# Patient Record
Sex: Male | Born: 1970 | Race: Black or African American | Hispanic: No | Marital: Single | Smoking: Never smoker
Health system: Southern US, Community
[De-identification: ages and names within clinical notes are randomized; demographics above are authoritative.]

## PROBLEM LIST (undated history)

## (undated) DIAGNOSIS — E785 Hyperlipidemia, unspecified: Secondary | ICD-10-CM

## (undated) DIAGNOSIS — I10 Essential (primary) hypertension: Secondary | ICD-10-CM

## (undated) DIAGNOSIS — T7840XA Allergy, unspecified, initial encounter: Secondary | ICD-10-CM

## (undated) HISTORY — PX: NO PAST SURGERIES: SHX2092

## (undated) HISTORY — DX: Hyperlipidemia, unspecified: E78.5

## (undated) HISTORY — DX: Essential (primary) hypertension: I10

## (undated) HISTORY — DX: Allergy, unspecified, initial encounter: T78.40XA

---

## 2011-02-11 ENCOUNTER — Emergency Department (HOSPITAL_COMMUNITY)
Admission: EM | Admit: 2011-02-11 | Discharge: 2011-02-12 | Disposition: A | Discharge status: Home / Self Care | Payer: BC Managed Care – PPO | Attending: Emergency Medicine | Admitting: Emergency Medicine

## 2011-02-11 ENCOUNTER — Emergency Department (HOSPITAL_COMMUNITY): Payer: BC Managed Care – PPO

## 2011-02-11 DIAGNOSIS — R1033 Periumbilical pain: Secondary | ICD-10-CM | POA: Insufficient documentation

## 2011-02-11 DIAGNOSIS — R110 Nausea: Secondary | ICD-10-CM | POA: Insufficient documentation

## 2011-02-11 DIAGNOSIS — R630 Anorexia: Secondary | ICD-10-CM | POA: Insufficient documentation

## 2011-02-12 LAB — DIFFERENTIAL
Basophils Relative: 0 % (ref 0–1)
Eosinophils Absolute: 0 10*3/uL (ref 0.0–0.7)
Eosinophils Relative: 0 % (ref 0–5)
Lymphs Abs: 0.9 10*3/uL (ref 0.7–4.0)

## 2011-02-12 LAB — CBC
MCV: 86.3 fL (ref 78.0–100.0)
Platelets: 181 10*3/uL (ref 150–400)
RDW: 13.4 % (ref 11.5–15.5)
WBC: 8 10*3/uL (ref 4.0–10.5)

## 2011-02-12 LAB — URINALYSIS, ROUTINE W REFLEX MICROSCOPIC
Nitrite: NEGATIVE
Specific Gravity, Urine: 1.021 (ref 1.005–1.030)
Urobilinogen, UA: 0.2 mg/dL (ref 0.0–1.0)

## 2011-02-12 LAB — HEPATIC FUNCTION PANEL
ALT: 76 U/L — ABNORMAL HIGH (ref 0–53)
Indirect Bilirubin: 0.4 mg/dL (ref 0.3–0.9)
Total Protein: 7.2 g/dL (ref 6.0–8.3)

## 2011-02-12 LAB — LIPASE, BLOOD: Lipase: 33 U/L (ref 11–59)

## 2011-02-12 LAB — URINE MICROSCOPIC-ADD ON

## 2011-02-12 LAB — BASIC METABOLIC PANEL
BUN: 10 mg/dL (ref 6–23)
Creatinine, Ser: 1.07 mg/dL (ref 0.4–1.5)
GFR calc Af Amer: >60 mL/min (ref >60)
GFR calc non Af Amer: >60 mL/min (ref >60)
Potassium: 4 mEq/L (ref 3.5–5.1)

## 2011-02-12 MED ORDER — IOHEXOL 300 MG/ML  SOLN
100.0000 mL | Freq: Once | INTRAMUSCULAR | Status: AC | PRN
  Administered 2011-02-12: 100 mL via INTRAVENOUS

## 2012-02-17 ENCOUNTER — Ambulatory Visit (INDEPENDENT_AMBULATORY_CARE_PROVIDER_SITE_OTHER): Payer: BC Managed Care – PPO | Admitting: Internal Medicine

## 2012-02-17 VITALS — BP 159/89 | HR 58 | Temp 98.0°F | Resp 12 | Ht 69.0 in | Wt 230.0 lb

## 2012-02-17 DIAGNOSIS — Z0000 Encounter for general adult medical examination without abnormal findings: Secondary | ICD-10-CM

## 2012-02-17 NOTE — Progress Notes (Signed)
  Subjective:    Patient ID: Hunter Adams, male    DOB: 1970/06/28, 42 y.o.   MRN: [id number]  HPI Feels great See scanned hx   Review of Systems    See scanned ros Objective:   Physical Exam  Constitutional: He is oriented to person, place, and time. He appears well-developed and well-nourished.  HENT:  Head: Normocephalic.  Eyes: Conjunctivae and EOM are normal. Pupils are equal, round, and reactive to light.  Neck: Normal range of motion. Neck supple. No thyromegaly present.  Cardiovascular: Normal rate, regular rhythm and normal heart sounds.   Pulmonary/Chest: Effort normal and breath sounds normal.  Abdominal: Soft. Bowel sounds are normal.  Genitourinary: Penis normal.  Musculoskeletal: Normal range of motion.  Lymphadenopathy:    He has no cervical adenopathy.  Neurological: He is alert and oriented to person, place, and time.  Skin: Skin is warm and dry.  Psychiatric: He has a normal mood and affect.          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  Agrees to labs, EKG, CXR  later

## 2012-09-19 ENCOUNTER — Ambulatory Visit (INDEPENDENT_AMBULATORY_CARE_PROVIDER_SITE_OTHER): Payer: BC Managed Care – PPO | Admitting: Emergency Medicine

## 2012-09-19 VITALS — BP 154/88 | HR 85 | Temp 98.1°F | Resp 16 | Ht 70.0 in | Wt 227.0 lb

## 2012-09-19 DIAGNOSIS — H1045 Other chronic allergic conjunctivitis: Secondary | ICD-10-CM

## 2012-09-19 DIAGNOSIS — J309 Allergic rhinitis, unspecified: Secondary | ICD-10-CM

## 2012-09-19 DIAGNOSIS — H1010 Acute atopic conjunctivitis, unspecified eye: Secondary | ICD-10-CM

## 2012-09-19 DIAGNOSIS — I10 Essential (primary) hypertension: Secondary | ICD-10-CM

## 2012-09-19 MED ORDER — FLUTICASONE PROPIONATE 50 MCG/ACT NA SUSP: 2.0000 | Freq: Every day | NASAL | Status: DC

## 2012-09-19 MED ORDER — PREDNISONE 10 MG PO TABS: ORAL_TABLET | ORAL | Status: DC

## 2012-09-19 MED ORDER — HYDROCHLOROTHIAZIDE 12.5 MG PO TABS: 12.5000 mg | ORAL_TABLET | Freq: Every day | ORAL | Status: DC

## 2012-09-19 MED ORDER — OLOPATADINE HCL 0.1 % OP SOLN: 1.0000 [drp] | Freq: Two times a day (BID) | OPHTHALMIC | Status: DC

## 2012-09-19 NOTE — Patient Instructions (Addendum)
Hypertension As your heart beats, it forces blood through your arteries. This force is your blood pressure. If the pressure is too high, it is called hypertension (HTN) or high blood pressure. HTN is dangerous because you may have it and not know it. High blood pressure may mean that your heart has to work harder to pump blood. Your arteries may be narrow or stiff. The extra work puts you at risk for heart disease, stroke, and other problems.  Blood pressure consists of two numbers, a higher number over a lower, 110/72, for example. It is stated as "110 over 72." The ideal is below 120 for the top number (systolic) and under 80 for the bottom (diastolic). Write down your blood pressure today. You should pay close attention to your blood pressure if you have certain conditions such as:  Heart failure.   Prior heart attack.   Diabetes   Chronic kidney disease.   Prior stroke.   Multiple risk factors for heart disease.  To see if you have HTN, your blood pressure should be measured while you are seated with your arm held at the level of the heart. It should be measured at least twice. A one-time elevated blood pressure reading (especially in the Emergency Department) does not mean that you need treatment. There may be conditions in which the blood pressure is different between your right and left arms. It is important to see your caregiver soon for a recheck. Most people have essential hypertension which means that there is not a specific cause. This type of high blood pressure may be lowered by changing lifestyle factors such as:  Stress.   Smoking.   Lack of exercise.   Excessive weight.   Drug/tobacco/alcohol use.   Eating less salt.  Most people do not have symptoms from high blood pressure until it has caused damage to the body. Effective treatment can often prevent, delay or reduce that damage. TREATMENT  When a cause has been identified, treatment for high blood pressure is  directed at the cause. There are a large number of medications to treat HTN. These fall into several categories, and your caregiver will help you select the medicines that are best for you. Medications may have side effects. You should review side effects with your caregiver. If your blood pressure stays high after you have made lifestyle changes or started on medicines,   Your medication(s) may need to be changed.   Other problems may need to be addressed.   Be certain you understand your prescriptions, and know how and when to take your medicine.   Be sure to follow up with your caregiver within the time frame advised (usually within two weeks) to have your blood pressure rechecked and to review your medications.   If you are taking more than one medicine to lower your blood pressure, make sure you know how and at what times they should be taken. Taking two medicines at the same time can result in blood pressure that is too low.  SEEK IMMEDIATE MEDICAL CARE IF:  You develop a severe headache, blurred or changing vision, or confusion.   You have unusual weakness or numbness, or a faint feeling.   You have severe chest or abdominal pain, vomiting, or breathing problems.  MAKE SURE YOU:   Understand these instructions.   Will watch your condition.   Will get help right away if you are not doing well or get worse.  Document Released: 12/17/2005 Document Revised: 12/06/2011 Document Reviewed:   08/06/2008 Exit[employer] Patient Information 2012 [place], [place].Allergic Rhinitis Allergic rhinitis is when the mucous membranes in the nose respond to allergens. Allergens are particles in the air that cause your body to have an allergic reaction. This causes you to release allergic antibodies. Through a chain of events, these eventually cause you to release histamine into the blood stream (hence the use of antihistamines). Although meant to be protective to the body, it is this release that causes your  discomfort, such as frequent sneezing, congestion and an itchy runny nose.  CAUSES  The pollen allergens may come from grasses, trees, and weeds. This is seasonal allergic rhinitis, or "hay fever." Other allergens cause year-round allergic rhinitis (perennial allergic rhinitis) such as house dust mite allergen, pet dander and mold spores.  SYMPTOMS   Nasal stuffiness (congestion).   Runny, itchy nose with sneezing and tearing of the eyes.   There is often an itching of the mouth, eyes and ears.  It cannot be cured, but it can be controlled with medications. DIAGNOSIS  If you are unable to determine the offending allergen, skin or blood testing may find it. TREATMENT   Avoid the allergen.   Medications and allergy shots (immunotherapy) can help.   Hay fever may often be treated with antihistamines in pill or nasal spray forms. Antihistamines block the effects of histamine. There are over-the-counter medicines that may help with nasal congestion and swelling around the eyes. Check with your caregiver before taking or giving this medicine.  If the treatment above does not work, there are many new medications your caregiver can prescribe. Stronger medications may be used if initial measures are ineffective. Desensitizing injections can be used if medications and avoidance fails. Desensitization is when a patient is given ongoing shots until the body becomes less sensitive to the allergen. Make sure you follow up with your caregiver if problems continue. SEEK MEDICAL CARE IF:   You develop fever (more than 100.5 F (38.1 C).   You develop a cough that does not stop easily (persistent).   You have shortness of breath.   You start wheezing.   Symptoms interfere with normal daily activities.  Document Released: 09/11/2001 Document Revised: 12/06/2011 Document Reviewed: 03/23/2009 [facility] Patient Information 2012 [place], [place].

## 2012-09-19 NOTE — Progress Notes (Signed)
  Subjective:    Patient ID: Hunter Adams, male    DOB: May 05, 1970, 42 y.o.   MRN: [id number]  HPI  Patient has been ill for approximately 2 weeks. He has more problems inside rather then going outside. He has significant head congestion and post nasal drip. He has not have had sore throat or cough. He as a history of allergies and has used nasal spray in the past. He was wearing contacts and removed them and his eyes became irritated.    Review of Systems When he was seen last time by Dr. Perrin Maltese he had elevated blood pressure.  He has family history of hypertension.  He has not been on medication for this.    Objective:   Physical Exam patient is alert cooperative in no distress. TMs are normal. Nasal turbinates are swollen. There is no airway on the right. Posterior pharynx is normal. Neck is supple without adenopathy. Chest is clear to both auscultation and percussion I repeated blood pressures in both arms and received 160/110 in both arms.        Assessment & Plan:  We'll place the patient on Patanol drops for his eye irritation as well as Flonase spray for his nasal congestion. We'll give him a short taper dose of prednisone. I did go ahead and start him on some blood pressure medication and will begin HCTZ 12.5 one a day and gave him instructions regarding sodium restriction weight loss and exercise.

## 2012-12-26 ENCOUNTER — Ambulatory Visit (INDEPENDENT_AMBULATORY_CARE_PROVIDER_SITE_OTHER): Payer: BC Managed Care – PPO | Admitting: Family Medicine

## 2012-12-26 VITALS — BP 156/96 | HR 70 | Temp 98.5°F | Resp 17 | Ht 69.0 in | Wt 229.0 lb

## 2012-12-26 DIAGNOSIS — M549 Dorsalgia, unspecified: Secondary | ICD-10-CM

## 2012-12-26 DIAGNOSIS — IMO0002 Reserved for concepts with insufficient information to code with codable children: Secondary | ICD-10-CM

## 2012-12-26 DIAGNOSIS — I10 Essential (primary) hypertension: Secondary | ICD-10-CM

## 2012-12-26 DIAGNOSIS — S39012A Strain of muscle, fascia and tendon of lower back, initial encounter: Secondary | ICD-10-CM

## 2012-12-26 MED ORDER — OXAPROZIN 600 MG PO TABS: ORAL_TABLET | ORAL | Status: DC

## 2012-12-26 MED ORDER — METHOCARBAMOL 750 MG PO TABS: ORAL_TABLET | ORAL | Status: DC

## 2012-12-26 MED ORDER — LISINOPRIL-HYDROCHLOROTHIAZIDE 20-12.5 MG PO TABS: 1.0000 | ORAL_TABLET | Freq: Every day | ORAL | Status: DC

## 2012-12-26 NOTE — Patient Instructions (Addendum)
Tonight use of heat and ice on the low back several times daily.  Take the muscle relaxant and anti-inflammatory pain medication as ordered  Stopped taking the hydrochlorothiazide for your blood pressure.  Begin the new blood pressure pill one daily. Return in one month to get her blood pressure rechecked.

## 2012-12-26 NOTE — Progress Notes (Signed)
Subjective: 42 year old man who has been low back. A couple of years ago he had a problem with pain at about the L5 level and this was consistent with a decreased of vertebral disc space. It calmed down and cut. This came back with no specific injury. He was hurting bad on Christmas morning. He also has an elevated blood pressure. He takes his blood pressure medicine he was started on a few months ago.   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  Objective: Moderately overweight Afro-American male in no severe distress. Chest clear. Heart regular without murmurs. Tender over the lower lumbar spine. Flexion is fair but is not able to bend all the way forward. Extension good. Side to side flexion is very adequate. Trunk rotation is good. Deep tender reflexes 1+, symmetrical. Straight leg raising test is negative for any radicular opportunity, however he does have more tightness and pain on the right side than the left but again above about 70.  Assessment: Low back pain and strain Hypertension unsatisfactory control  Plan: Increase the blood pressure medication Work hard on weight loss We will give anti-inflammatory and muscle relaxant medication for the back. If it continues to hurt he is to return.

## 2013-08-20 ENCOUNTER — Ambulatory Visit: Payer: BC Managed Care – PPO

## 2013-08-20 ENCOUNTER — Ambulatory Visit (INDEPENDENT_AMBULATORY_CARE_PROVIDER_SITE_OTHER): Payer: BC Managed Care – PPO | Admitting: Physician Assistant

## 2013-08-20 VITALS — BP 118/76 | HR 74 | Temp 98.8°F | Resp 17 | Ht 69.0 in | Wt 226.0 lb

## 2013-08-20 DIAGNOSIS — S9030XA Contusion of unspecified foot, initial encounter: Secondary | ICD-10-CM

## 2013-08-20 DIAGNOSIS — M25571 Pain in right ankle and joints of right foot: Secondary | ICD-10-CM

## 2013-08-20 DIAGNOSIS — M25579 Pain in unspecified ankle and joints of unspecified foot: Secondary | ICD-10-CM

## 2013-08-20 DIAGNOSIS — S9031XA Contusion of right foot, initial encounter: Secondary | ICD-10-CM

## 2013-08-20 MED ORDER — HYDROCODONE-ACETAMINOPHEN 5-325 MG PO TABS: 1.0000 | ORAL_TABLET | Freq: Four times a day (QID) | ORAL | Status: DC | PRN

## 2013-08-20 NOTE — Progress Notes (Signed)
Patient ID: Hunter Adams MRN: [id number], DOB: 10/09/1970, 43 y.o. Date of Encounter: 08/20/2013, 10:15 AM  Primary Physician: No primary provider on file.  Chief Complaint: Right foot pain  HPI: 43 y.o. male with history below presents with 1 week history of right foot pain after dropping a metal extender on top of his mid foot. The metal extender is about 10 feet long and weighs between 80-100 pounds. This piece of equipment landed on the part of his boot that is proximal to his steel toe portion. He thought the pain would improve, however it has not. He has pain with ambulation. No pain when at rest and he is not applying weight to the foot. Pain is located along the mid foot and extends distally towards digits 1-3. Never any swelling, bruising, erythema, or laceration.    Past Medical History  Diagnosis Date  . Allergy      Home Meds: Prior to Admission medications   Medication Sig Start Date End Date Taking? Authorizing Provider  fluticasone (FLONASE) 50 MCG/ACT nasal spray Place 2 sprays into the nose daily. 09/19/12  Yes Collene Gobble, MD  hydrochlorothiazide (HYDRODIURIL) 12.5 MG tablet Take 1 tablet (12.5 mg total) by mouth daily. 09/19/12  Yes Collene Gobble, MD  lisinopril-hydrochlorothiazide (ZESTORETIC) 20-12.5 MG per tablet Take 1 tablet by mouth daily. 12/26/12  Yes Peyton Najjar, MD    Allergies: No Known Allergies  History   Social History  . Marital Status: Single    Spouse Name: N/A    Number of Children: N/A  . Years of Education: N/A   Occupational History  . Not on file.   Social History Main Topics  . Smoking status: Never Smoker   . Smokeless tobacco: Not on file  . Alcohol Use: Yes  . Drug Use: No  . Sexual Activity: Yes   Other Topics Concern  . Not on file   Social History Narrative  . No narrative on file     Review of Systems: Constitutional: negative for chills, fever, or fatigue  Dermatological: see above Neurologic: negative  for paresthesias    Physical Exam: Blood pressure 118/76, pulse 74, temperature 98.8 F (37.1 C), temperature source Oral, resp. rate 17, height 5\' 9"  (1.753 m), weight 226 lb (102.513 kg), SpO2 99.00%., Body mass index is 33.36 kg/(m^2). General: Well developed, well nourished, in no acute distress. Head: Normocephalic, atraumatic, eyes without discharge, sclera non-icteric, nares are without discharge.   Neck: Supple. Full ROM.  Lungs: Breathing is unlabored. Heart: Regular rate. Msk:  Strength and tone normal for age. Extremities/Skin: Warm and dry. No clubbing or cyanosis. No edema. No rashes or suspicious lesions. Right foot: Mild STS along the great toe. No ecchymosis, erythema, or other wound. Mild TTP along the mid foot that extends distally towards digits 2 and 3. No TTP of digits 2 and 3. TTP at the 1st MTP joint. (No h/o gout) FROM. No TTP of the medial or lateral malleoli. No TTP of the 5th metatarsal. Talar tilt unremarkable. Unremarkable anterior drawer. Unremarkable sensation.  Neuro: Alert and oriented X 3. Moves all extremities spontaneously. Gait is normal. CNII-XII grossly in tact. Psych:  Responds to questions appropriately with a normal affect.   Right foot: [facility] reading (PRIMARY) by  Dr. Perrin Maltese. Negative  ASSESSMENT AND PLAN:  43 y.o. male with contusion and pain of the right foot -Post op shoe -Norco 5/325 mg 1 po q 4-6 hours prn pain #30 no RF, SED -  [facility] through 08/23/13 -RICE -Recheck 72 hours   Signed, Eula Listen, PA-C 08/20/2013 10:15 AM

## 2014-01-03 ENCOUNTER — Other Ambulatory Visit: Payer: Self-pay | Admitting: Family Medicine

## 2014-02-15 ENCOUNTER — Other Ambulatory Visit: Payer: Self-pay | Admitting: Physician Assistant

## 2014-02-22 ENCOUNTER — Other Ambulatory Visit: Payer: Self-pay | Admitting: Physician Assistant

## 2014-03-10 ENCOUNTER — Other Ambulatory Visit: Payer: Self-pay | Admitting: Physician Assistant

## 2014-03-13 ENCOUNTER — Ambulatory Visit (INDEPENDENT_AMBULATORY_CARE_PROVIDER_SITE_OTHER): Payer: BC Managed Care – PPO | Admitting: Family Medicine

## 2014-03-13 VITALS — BP 138/86 | HR 66 | Temp 98.6°F | Resp 18 | Ht 68.5 in | Wt 231.0 lb

## 2014-03-13 DIAGNOSIS — I10 Essential (primary) hypertension: Secondary | ICD-10-CM

## 2014-03-13 LAB — COMPREHENSIVE METABOLIC PANEL
ALBUMIN: 4.5 g/dL (ref 3.5–5.2)
ALK PHOS: 84 U/L (ref 39–117)
ALT: 25 U/L (ref 0–53)
AST: 24 U/L (ref 0–37)
BUN: 12 mg/dL (ref 6–23)
CALCIUM: 9.6 mg/dL (ref 8.4–10.5)
CHLORIDE: 103 meq/L (ref 96–112)
CO2: 30 meq/L (ref 19–32)
Creat: 1.15 mg/dL (ref 0.50–1.35)
GLUCOSE: 95 mg/dL (ref 70–99)
POTASSIUM: 4.2 meq/L (ref 3.5–5.3)
SODIUM: 139 meq/L (ref 135–145)
TOTAL PROTEIN: 7.6 g/dL (ref 6.0–8.3)
Total Bilirubin: 0.7 mg/dL (ref 0.2–1.2)

## 2014-03-13 LAB — LIPID PANEL
Cholesterol: 187 mg/dL (ref 0–200)
HDL: 39 mg/dL — AB (ref >39)
LDL CALC: 134 mg/dL — AB (ref 0–99)
TRIGLYCERIDES: 68 mg/dL (ref <150)
Total CHOL/HDL Ratio: 4.8 Ratio
VLDL: 14 mg/dL (ref 0–40)

## 2014-03-13 MED ORDER — LISINOPRIL-HYDROCHLOROTHIAZIDE 20-12.5 MG PO TABS: ORAL_TABLET | ORAL | Status: DC

## 2014-03-13 NOTE — Patient Instructions (Signed)
Continue current medications  Try to eat less in order to lose some weight  Return in about 6 months which would be mid-September.

## 2014-03-13 NOTE — Progress Notes (Signed)
Subjective: Patient is here for a refill of his blood pressure medications. He checks it about once a month in [facility] and it is usually good. He has no major complaints. 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 He gets exercise with the coaching. No chest pain, shortness of breath, palpitations, energy loss, or any other major complaints. He had some question about supplements I told him I didn't think they were needed.  Objective: In no acute distress. Throat clear. Neck supple without nodes. TMs normal. Chest clear. Heart regular without murmurs.  Assessment: Hypertension controlled  Plan: Watch his weight Continue same medications Return in 6 months.

## 2014-03-15 ENCOUNTER — Encounter: Payer: Self-pay | Admitting: *Deleted

## 2014-06-05 ENCOUNTER — Emergency Department (HOSPITAL_COMMUNITY)
Admission: EM | Admit: 2014-06-05 | Discharge: 2014-06-05 | Disposition: A | Discharge status: Home / Self Care | Payer: BC Managed Care – PPO | Attending: Emergency Medicine | Admitting: Emergency Medicine

## 2014-06-05 ENCOUNTER — Emergency Department (HOSPITAL_COMMUNITY): Payer: BC Managed Care – PPO

## 2014-06-05 ENCOUNTER — Encounter (HOSPITAL_COMMUNITY): Payer: Self-pay | Admitting: Emergency Medicine

## 2014-06-05 DIAGNOSIS — I10 Essential (primary) hypertension: Secondary | ICD-10-CM | POA: Insufficient documentation

## 2014-06-05 DIAGNOSIS — IMO0002 Reserved for concepts with insufficient information to code with codable children: Secondary | ICD-10-CM | POA: Insufficient documentation

## 2014-06-05 DIAGNOSIS — Y9289 Other specified places as the place of occurrence of the external cause: Secondary | ICD-10-CM | POA: Insufficient documentation

## 2014-06-05 DIAGNOSIS — Y9389 Activity, other specified: Secondary | ICD-10-CM | POA: Insufficient documentation

## 2014-06-05 DIAGNOSIS — S51809A Unspecified open wound of unspecified forearm, initial encounter: Secondary | ICD-10-CM | POA: Insufficient documentation

## 2014-06-05 DIAGNOSIS — Z79899 Other long term (current) drug therapy: Secondary | ICD-10-CM | POA: Insufficient documentation

## 2014-06-05 MED ORDER — BACITRACIN 500 UNIT/GM EX OINT
1.0000 "application " | TOPICAL_OINTMENT | Freq: Once | CUTANEOUS | Status: AC
  Administered 2014-06-05: 1 via TOPICAL
  Filled 2014-06-05: qty 0.9

## 2014-06-05 MED ORDER — HYDROMORPHONE HCL PF 1 MG/ML IJ SOLN
1.0000 mg | Freq: Once | INTRAMUSCULAR | Status: AC
  Administered 2014-06-05: 1 mg via INTRAMUSCULAR
  Filled 2014-06-05: qty 1

## 2014-06-05 NOTE — ED Notes (Signed)
MD at bedside. 

## 2014-06-05 NOTE — ED Notes (Signed)
Dr. Roxy Cedar at bedside to suture laceration.

## 2014-06-05 NOTE — ED Notes (Signed)
Clean bandage applied at triage.

## 2014-06-05 NOTE — Discharge Instructions (Signed)

## 2014-06-05 NOTE — ED Provider Notes (Signed)
CSN: [id number]     Arrival date & time 06/05/14  1415 History   First MD Initiated Contact with Patient 06/05/14 1906     Chief Complaint  Patient presents with  . Extremity Laceration     (Consider location/radiation/quality/duration/timing/severity/associated sxs/prior Treatment) Patient is a 44 y.o. male presenting with arm injury. The history is provided by the patient and the spouse.  Arm Injury Location:  Arm Arm location:  R arm Pain details:    Quality:  Aching   Radiates to:  Does not radiate   Severity:  Severe   Onset quality:  Sudden   Timing:  Constant   Progression:  Unchanged Chronicity:  New Foreign body present:  Unable to specify Tetanus status:  Up to date Prior injury to area:  No Relieved by:  Nothing Worsened by:  Movement Ineffective treatments:  None tried Associated symptoms: no back pain, no fever, no numbness and no tingling     44 yo male with RUE lac. Onset just PTA. Punched windshield. Not forthcoming with further details. No other injuries. tdap utd. No numbness/tingling/weakness.   Past Medical History  Diagnosis Date  . Allergy   . Hypertension    History reviewed. No pertinent past surgical history. Family History  Problem Relation Age of Onset  . Hypertension Mother   . Diabetes Mother   . Hypertension Father   . Hypertension Maternal Grandmother   . Hypertension Maternal Grandfather   . Hypertension Paternal Grandmother   . Hypertension Paternal Grandfather    History  Substance Use Topics  . Smoking status: Never Smoker   . Smokeless tobacco: Not on file  . Alcohol Use: Yes    Review of Systems  Constitutional: Negative for fever.  Respiratory: Negative for cough and shortness of breath.   Cardiovascular: Negative for chest pain.  Gastrointestinal: Negative for nausea, vomiting and abdominal pain.  Musculoskeletal: Negative for back pain.  Skin: Positive for wound.  Neurological: Negative for weakness, numbness and  headaches.  All other systems reviewed and are negative.     Allergies  Review of patient's allergies indicates no known allergies.  Home Medications   Prior to Admission medications   Medication Sig Start Date End Date Taking? Authorizing Provider  fluticasone (FLONASE) 50 MCG/ACT nasal spray Place 2 sprays into the nose daily. 09/19/12   Collene Gobble, MD  hydrochlorothiazide (HYDRODIURIL) 12.5 MG tablet Take 1 tablet (12.5 mg total) by mouth daily. 09/19/12   Collene Gobble, MD  HYDROcodone-acetaminophen (NORCO/VICODIN) 5-325 MG per tablet Take 1 tablet by mouth every 6 (six) hours as needed for pain. 08/20/13   Sondra Barges, PA-C  lisinopril-hydrochlorothiazide (PRINZIDE,ZESTORETIC) 20-12.5 MG per tablet Take one daily for blood pressure 03/13/14   Peyton Najjar, MD   BP 141/93  Pulse 78  Temp(Src) 98.5 F (36.9 C) (Oral)  Resp 17  SpO2 100% Physical Exam  Nursing note and vitals reviewed. Constitutional: He is oriented to person, place, and time. He appears well-developed and well-nourished. No distress.  HENT:  Head: Normocephalic and atraumatic.  Eyes: Conjunctivae are normal. Right eye exhibits no discharge. Left eye exhibits no discharge.  Cardiovascular: Normal rate, regular rhythm, normal heart sounds and intact distal pulses.   Pulmonary/Chest: Effort normal and breath sounds normal. No respiratory distress. He has no wheezes. He has no rales.  Abdominal: Soft. He exhibits no distension. There is no tenderness. There is no guarding.  Musculoskeletal: He exhibits tenderness. He exhibits no edema.  Right forearm: He exhibits tenderness and deformity (large laceration/avulation just distal to elbow. not at joint. NV intact distally (radial, ulnar, median intact).  hemostatic.). He exhibits no bony tenderness.  Small punctate lac to dorsum of hand. Appears to have small piece of glass in it. Hemostatic. NV intact distally.  Neurological: He is alert and oriented to person,  place, and time.  Skin: Skin is warm and dry.  Psychiatric: He has a normal mood and affect. His behavior is normal.    ED Course  LACERATION REPAIR Date/Time: 06/06/2014 12:13 AM Performed by: Stevie KernMCLENNAN, Cami Delawder Authorized by: Linwood DibblesKNAPP, JON Consent: Verbal consent obtained. Consent given by: patient Body area: upper extremity Location details: right lower arm Wound length (cm): ~6cm. Foreign bodies: no foreign bodies Tendon involvement: none Nerve involvement: none Vascular damage: no Anesthesia: local infiltration Local anesthetic: lidocaine 2% with epinephrine Anesthetic total: 8 ml Preparation: Patient was prepped and draped in the usual sterile fashion. Irrigation solution: saline Irrigation method: syringe Amount of cleaning: extensive Debridement: minimal Skin closure: 4-0 Prolene Subcutaneous closure: 4-0 Vicryl Number of sutures: 9 simple prolene. 3 deep vicryl. Technique: simple Approximation difficulty: complex Patient tolerance: Patient tolerated the procedure well with no immediate complications. Comments: Part of laceration with loss of skin. To heal by 2/2 intent.  FOREIGN BODY REMOVAL Date/Time: 06/06/2014 12:15 AM Performed by: Stevie KernMCLENNAN, Cutter Passey Authorized by: Linwood DibblesKNAPP, JON Consent: Verbal consent obtained. Time out: Immediately prior to procedure a "time out" was called to verify the correct patient, procedure, equipment, support staff and site/side marked as required. Intake: right hand. Anesthesia: local infiltration Local anesthetic: lidocaine 2% with epinephrine Anesthetic total (ml): 0.5. Complexity: simple Number of foreign bodies recovered: 1 small piece of glass. Post-procedure assessment: foreign body removed Patient tolerance: Patient tolerated the procedure well with no immediate complications.   (including critical care time) Labs Review Labs Reviewed - No data to display  Imaging Review Dg Forearm Right  06/05/2014   CLINICAL DATA:  Pain post  trauma  EXAM: RIGHT FOREARM - 2 VIEW  COMPARISON:  None.  FINDINGS: Frontal and lateral views were obtained. There is soft tissue injury at the level of the elbow joint medially. No radiopaque foreign body identified beyond overlying bandage. No fracture or dislocation. Joint spaces appear intact.  IMPRESSION: No fracture or dislocation. No radiopaque foreign body beyond overlying bandage.   Electronically Signed   By: Bretta BangWilliam  Woodruff M.D.   On: 06/05/2014 15:54     EKG Interpretation None      MDM   Final diagnoses:  Laceration    Complex lac repair as above. Irrigated thoroughly. No FB on xray. Does have FB in hand that was removed. NV intact. Although after lidocaine administered endorsed some mild numbness to digits 3&4. Without nerve or tendon involvement on exam. Motor intact pre and post repair. tdap utd.  Patient discharged home. Return precautions given. To follow up with pcp or return in 8-10 days for suture removal. patient in agreement with plan. To f/u pcp sooner if numbness persists.   Discussed case with Dr. Lynelle DoctorKnapp who is in agreement with assessment and plan.      Stevie Kernyan Cormick Moss, MD 06/06/14 [phone]

## 2014-06-05 NOTE — ED Notes (Signed)
Pt reports punching a windshield around 1300 and has large laceration to right forearm, bleeding controlled at triage.

## 2014-06-05 NOTE — ED Notes (Signed)
Suture cart placed at bedside. 

## 2014-06-06 NOTE — ED Provider Notes (Signed)
I saw and evaluated the patient, reviewed the resident's note and I agree with the findings and plan.  S/p laceration from broken glass.  No FB noted on xray or wound exploration.  N/V intact.  Irregular wound was repaired by Dr Roxy Cedar.   I was present during key portions of the procedure,  Linwood Dibbles, MD 06/06/14 [phone]

## 2014-06-17 ENCOUNTER — Ambulatory Visit (INDEPENDENT_AMBULATORY_CARE_PROVIDER_SITE_OTHER): Payer: BC Managed Care – PPO | Admitting: Family Medicine

## 2014-06-17 VITALS — BP 132/90 | HR 67 | Temp 98.4°F | Resp 16 | Ht 70.0 in | Wt 237.0 lb

## 2014-06-17 DIAGNOSIS — S51009A Unspecified open wound of unspecified elbow, initial encounter: Secondary | ICD-10-CM

## 2014-06-17 DIAGNOSIS — I10 Essential (primary) hypertension: Secondary | ICD-10-CM

## 2014-06-17 DIAGNOSIS — Z4802 Encounter for removal of sutures: Secondary | ICD-10-CM

## 2014-06-17 DIAGNOSIS — Z23 Encounter for immunization: Secondary | ICD-10-CM

## 2014-06-17 NOTE — Patient Instructions (Addendum)
Tetanus, Diphtheria, Pertussis (Tdap) Vaccine What You Need to Know WHY GET VACCINATED? Tetanus, diphtheria and pertussis can be very serious diseases, even for adolescents and adults. Tdap vaccine can protect us from these diseases. TETANUS (Lockjaw) causes painful muscle tightening and stiffness, usually all over the body.  It can lead to tightening of muscles in the head and neck so you can't open your mouth, swallow, or sometimes even breathe. Tetanus kills about 1 out of 5 people who are infected. DIPHTHERIA can cause a thick coating to form in the back of the throat.  It can lead to breathing problems, paralysis, heart failure, and death. PERTUSSIS (Whooping Cough) causes severe coughing spells, which can cause difficulty breathing, vomiting and disturbed sleep.  It can also lead to weight loss, incontinence, and rib fractures. Up to 2 in 100 adolescents and 5 in 100 adults with pertussis are hospitalized or have complications, which could include pneumonia and death. These diseases are caused by bacteria. Diphtheria and pertussis are spread from person to person through coughing or sneezing. Tetanus enters the body through cuts, scratches, or wounds. Before vaccines, the [place] saw as many as 200,000 cases a year of diphtheria and pertussis, and hundreds of cases of tetanus. Since vaccination began, tetanus and diphtheria have dropped by about 99% and pertussis by about 80%. TDAP VACCINE Tdap vaccine can protect adolescents and adults from tetanus, diphtheria, and pertussis. One dose of Tdap is routinely given at age 11 or 12. People who did not get Tdap at that age should get it as soon as possible. Tdap is especially important for health care professionals and anyone having close contact with a baby younger than 12 months. Pregnant women should get a dose of Tdap during every pregnancy, to protect the newborn from pertussis. Infants are most at risk for severe, life-threatening  complications from pertussis. A similar vaccine, called Td, protects from tetanus and diphtheria, but not pertussis. A Td booster should be given every 10 years. Tdap may be given as one of these boosters if you have not already gotten a dose. Tdap may also be given after a severe cut or burn to prevent tetanus infection. Your doctor can give you more information. Tdap may safely be given at the same time as other vaccines. SOME PEOPLE SHOULD NOT GET THIS VACCINE  If you ever had a life-threatening allergic reaction after a dose of any tetanus, diphtheria, or pertussis containing vaccine, OR if you have a severe allergy to any part of this vaccine, you should not get Tdap. Tell your doctor if you have any severe allergies.  If you had a coma, or long or multiple seizures within 7 days after a childhood dose of DTP or DTaP, you should not get Tdap, unless a cause other than the vaccine was found. You can still get Td.  Talk to your doctor if you:  have epilepsy or another nervous system problem,  had severe pain or swelling after any vaccine containing diphtheria, tetanus or pertussis,  ever had Guillain-Barr Syndrome (GBS),  aren't feeling well on the day the shot is scheduled. RISKS OF A VACCINE REACTION With any medicine, including vaccines, there is a chance of side effects. These are usually mild and go away on their own, but serious reactions are also possible. Brief fainting spells can follow a vaccination, leading to injuries from falling. Sitting or lying down for about 15 minutes can help prevent these. Tell your doctor if you feel dizzy or light-headed, or   have vision changes or ringing in the ears. Mild problems following Tdap (Did not interfere with activities)  Pain where the shot was given (about 3 in 4 adolescents or 2 in 3 adults)  Redness or swelling where the shot was given (about 1 person in 5)  Mild fever of at least 100.32F (up to about 1 in 25 adolescents or 1 in  100 adults)  Headache (about 3 or 4 people in 10)  Tiredness (about 1 person in 3 or 4)  Nausea, vomiting, diarrhea, stomach ache (up to 1 in 4 adolescents or 1 in 10 adults)  Chills, body aches, sore joints, rash, swollen glands (uncommon) Moderate problems following Tdap (Interfered with activities, but did not require medical attention)  Pain where the shot was given (about 1 in 5 adolescents or 1 in 100 adults)  Redness or swelling where the shot was given (up to about 1 in 16 adolescents or 1 in 25 adults)  Fever over 102F (about 1 in 100 adolescents or 1 in 250 adults)  Headache (about 3 in 20 adolescents or 1 in 10 adults)  Nausea, vomiting, diarrhea, stomach ache (up to 1 or 3 people in 100)  Swelling of the entire arm where the shot was given (up to about 3 in 100). Severe problems following Tdap (Unable to perform usual activities, required medical attention)  Swelling, severe pain, bleeding and redness in the arm where the shot was given (rare). A severe allergic reaction could occur after any vaccine (estimated less than 1 in a million doses). WHAT IF THERE IS A SERIOUS REACTION? What should I look for?  Look for anything that concerns you, such as signs of a severe allergic reaction, very high fever, or behavior changes. Signs of a severe allergic reaction can include hives, swelling of the face and throat, difficulty breathing, a fast heartbeat, dizziness, and weakness. These would start a few minutes to a few hours after the vaccination. What should I do?  If you think it is a severe allergic reaction or other emergency that can't wait, call 9-1-1 or get the person to the nearest hospital. Otherwise, call your doctor.  Afterward, the reaction should be reported to the "Vaccine Adverse Event Reporting System" (VAERS). Your doctor might file this report, or you can do it yourself through the VAERS web site at www.vaers.LAgents.nohhs.gov, or by calling 1-309 136 7437. VAERS is  only for reporting reactions. They do not give medical advice.  THE NATIONAL VACCINE INJURY COMPENSATION PROGRAM The National Vaccine Injury Compensation Program (VICP) is a federal program that was created to compensate people who may have been injured by certain vaccines. Persons who believe they may have been injured by a vaccine can learn about the program and about filing a claim by calling 1-720-772-4540 or visiting the VICP website at SpiritualWord.atwww.hrsa.gov/vaccinecompensation. HOW CAN I LEARN MORE?  Ask your doctor.  Call your local or state health department.  Contact the Centers for Disease Control and Prevention (CDC):  Call (816)593-87591-573-111-7597 or visit CDC's website at PicCapture.uywww.cdc.gov/vaccines. CDC Tdap Vaccine VIS (05/08/12) Document Released: 06/17/2012 Document Revised: 04/13/2013 Document Reviewed: 04/08/2013 ExitCare Patient Information 2015 [employer]. This information is not intended to replace advice given to you by your health care provider. Make sure you discuss any questions you have with your health care provider.  Continue blood pressure medication  Return in about 6 months which will be just before Christmas time.

## 2014-06-17 NOTE — Progress Notes (Signed)
Subjective: Patient fell through some glass about 12 days ago and head 12 sutures placed in his right elbow. He is here for sutures to be removed. This was done in the emergency room. He also has a history of hypertension and is getting rechecked for that. He is doing well otherwise. He is not getting a lot of regular exercise we talked about the need to continue to try and pay attention to his weight  Objective: Healthy-appearing young man. Sutures were removed from the wound. The main portion of the wound looks like it is fairly adequately healing. The medial portion of it shows a healing raw area missing about 1 x 1 cm of skin. It is slowly healing in and has crusting to it. Not ready for debridement yet.  Chest clear. Heart regular without murmurs.  Assessment: Hypertension, borderline control Weight gain Right elbow wound  Plan: Sutures removed without difficulty. Steri-Strips applied. Instructions given. He did not get a tetanus shot in the emergency room. He was in the [employer] up until 5 years ago. I recommended he go ahead and have tetanus booster that was given.

## 2014-12-27 ENCOUNTER — Ambulatory Visit (INDEPENDENT_AMBULATORY_CARE_PROVIDER_SITE_OTHER): Payer: BC Managed Care – PPO | Admitting: Emergency Medicine

## 2014-12-27 VITALS — BP 128/80 | HR 73 | Temp 98.0°F | Resp 18 | Ht 68.75 in | Wt 236.0 lb

## 2014-12-27 DIAGNOSIS — I10 Essential (primary) hypertension: Secondary | ICD-10-CM

## 2014-12-27 LAB — COMPREHENSIVE METABOLIC PANEL
ALBUMIN: 4.4 g/dL (ref 3.5–5.2)
ALK PHOS: 89 U/L (ref 39–117)
ALT: 23 U/L (ref 0–53)
AST: 22 U/L (ref 0–37)
BUN: 11 mg/dL (ref 6–23)
CALCIUM: 9.7 mg/dL (ref 8.4–10.5)
CO2: 27 mEq/L (ref 19–32)
CREATININE: 1.16 mg/dL (ref 0.50–1.35)
Chloride: 105 mEq/L (ref 96–112)
Glucose, Bld: 93 mg/dL (ref 70–99)
POTASSIUM: 4.1 meq/L (ref 3.5–5.3)
Sodium: 140 mEq/L (ref 135–145)
TOTAL PROTEIN: 7.4 g/dL (ref 6.0–8.3)
Total Bilirubin: 0.4 mg/dL (ref 0.2–1.2)

## 2014-12-27 LAB — LIPID PANEL
CHOL/HDL RATIO: 5.5 ratio
Cholesterol: 192 mg/dL (ref 0–200)
HDL: 35 mg/dL — ABNORMAL LOW (ref >39)
LDL Cholesterol: 143 mg/dL — ABNORMAL HIGH (ref 0–99)
Triglycerides: 68 mg/dL (ref <150)
VLDL: 14 mg/dL (ref 0–40)

## 2014-12-27 LAB — POCT CBC
Granulocyte percent: 54.9 %G (ref 37–80)
HEMATOCRIT: 41 % — AB (ref 43.5–53.7)
Hemoglobin: 13.2 g/dL — AB (ref 14.1–18.1)
LYMPH, POC: 2.1 (ref 0.6–3.4)
MCH: 28.4 pg (ref 27–31.2)
MCHC: 32.2 g/dL (ref 31.8–35.4)
MCV: 88 fL (ref 80–97)
MID (cbc): 0.6 (ref 0–0.9)
MPV: 7.5 fL (ref 0–99.8)
POC GRANULOCYTE: 3.3 (ref 2–6.9)
POC LYMPH PERCENT: 35.6 %L (ref 10–50)
POC MID %: 9.5 %M (ref 0–12)
Platelet Count, POC: 191 10*3/uL (ref 142–424)
RBC: 4.65 M/uL — AB (ref 4.69–6.13)
RDW, POC: 14.6 %
WBC: 6 10*3/uL (ref 4.6–10.2)

## 2014-12-27 MED ORDER — LISINOPRIL-HYDROCHLOROTHIAZIDE 20-12.5 MG PO TABS: ORAL_TABLET | ORAL | Status: DC

## 2014-12-27 NOTE — Patient Instructions (Signed)

## 2014-12-27 NOTE — Progress Notes (Signed)
[facility] [address], [place] [address] [phone]  Date:  12/27/2014   Name:  Hunter ColtChristopher L Adams   DOB:  1970/09/02   MRN:  [id number]  PCP:  No PCP Per Patient    Chief Complaint: Medication Refill   History of Present Illness:  Hunter ColtChristopher L Adams is a 44 y.o. very pleasant male patient who presents with the following:  Has been on lisinopril/HCTZ for past 18 months and is tolerating the medication with no adverse effects. No end organ effects Fasting Non smoker. No improvement with over the counter medications or other home remedies.  Denies other complaint or health concern today.   Patient Active Problem List   Diagnosis Date Noted  . HTN (hypertension) 06/17/2014    Past Medical History  Diagnosis Date  . Allergy   . Hypertension     History reviewed. No pertinent past surgical history.  History  Substance Use Topics  . Smoking status: Never Smoker   . Smokeless tobacco: Not on file  . Alcohol Use: Yes    Family History  Problem Relation Age of Onset  . Hypertension Mother   . Diabetes Mother   . Hypertension Father   . Hypertension Maternal Grandmother   . Hypertension Maternal Grandfather   . Hypertension Paternal Grandmother   . Hypertension Paternal Grandfather     No Known Allergies  Medication list has been reviewed and updated.  Current Outpatient Prescriptions on File Prior to Visit  Medication Sig Dispense Refill  . lisinopril-hydrochlorothiazide (PRINZIDE,ZESTORETIC) 20-12.5 MG per tablet Take one daily for blood pressure 90 tablet 3  . fluticasone (FLONASE) 50 MCG/ACT nasal spray Place 2 sprays into the nose daily. (Patient not taking: Reported on 12/27/2014) 16 g 6  . hydrochlorothiazide (HYDRODIURIL) 12.5 MG tablet Take 1 tablet (12.5 mg total) by mouth daily. (Patient not taking: Reported on 12/27/2014) 30 tablet 3  . HYDROcodone-acetaminophen (NORCO/VICODIN) 5-325 MG per tablet Take 1 tablet by mouth  every 6 (six) hours as needed for pain. (Patient not taking: Reported on 12/27/2014) 30 tablet 0   No current facility-administered medications on file prior to visit.    Review of Systems:  As per HPI, otherwise negative.    Physical Examination: Filed Vitals:   12/27/14 0905  BP: 128/80  Pulse: 73  Temp: 98 F (36.7 C)  Resp: 18   Filed Vitals:   12/27/14 0905  Height: 5' 8.75" (1.746 m)  Weight: 236 lb (107.049 kg)   Body mass index is 35.12 kg/(m^2). Ideal Body Weight: Weight in (lb) to have BMI = 25: 167.7  GEN: WDWN, NAD, Non-toxic, A & O x 3 HEENT: Atraumatic, Normocephalic. Neck supple. No masses, No LAD. Ears and Nose: No external deformity. CV: RRR, No M/G/R. No JVD. No thrill. No extra heart sounds. PULM: CTA B, no wheezes, crackles, rhonchi. No retractions. No resp. distress. No accessory muscle use. ABD: S, NT, ND, +BS. No rebound. No HSM. EXTR: No c/c/e NEURO Normal gait.  PSYCH: Normally interactive. Conversant. Not depressed or anxious appearing.  Calm demeanor.    Assessment and Plan: Hypertension Labs Refill meds  Signed,  Phillips OdorJeffery Ytzel Gubler, MD

## 2015-01-07 ENCOUNTER — Ambulatory Visit (INDEPENDENT_AMBULATORY_CARE_PROVIDER_SITE_OTHER): Payer: BC Managed Care – PPO | Admitting: Family Medicine

## 2015-01-07 VITALS — BP 128/80 | HR 72 | Temp 98.2°F | Resp 18 | Ht 69.0 in | Wt 240.2 lb

## 2015-01-07 DIAGNOSIS — Z0000 Encounter for general adult medical examination without abnormal findings: Secondary | ICD-10-CM

## 2015-01-07 DIAGNOSIS — I10 Essential (primary) hypertension: Secondary | ICD-10-CM

## 2015-01-07 NOTE — Patient Instructions (Signed)
Take your medications regularly. Return in 6 months to 1 year for follow-up of your blood pressure. Sooner if problems arise.

## 2015-01-07 NOTE — Progress Notes (Signed)
Physical Exam  History: 45 year old man is here for his physical examination. He has generally been very healthy.  Past medical history: Medications: Lisinopril HCT Allergies: None known medication allergies Illnesses: Seasonal allergies Operations: Broken nose  Family history: Father is deceased from multiple issues. Mother is living, has diabetes. Patient has 3 children.  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 Does not smoke. Does drink about once a week. Does not use any illicit drugs. Is single, sexually active with male partner. Does have a college degree. He hopes to go back to school and study photography.  Review of systems: Constitutional: Unremarkable HEENT: Unremarkable Respiratory: Unremarkable Cardiovascular: Unremarkable Gastrointestinal: Unremarkable Endocrine: Unremarkable Genitourinary: Unremarkable Musculoskeletal: Has some low back pain. Also when he is doing sit ups he gets some right abdominal wall pain Dermatologic: Unremarkable Allergy: Has seasonal allergies as mentioned above Neurologic: Unremarkable Hematologic: Unremarkable Psychiatric: Unremarkable  Preventive history: Has not had EKG, PSA, bone density, colonoscopy done.  Immunization history: Vaccines including flu vaccine and tetanus are up-to-date. Tetanus shot was this past summer.  Physical exam: Well-developed muscular man in no major distress. TMs normal. Eyes PERRLA. Throat clear. Neck supple without nodes or thyromegaly. Chest is clear to auscultation. Heart regular without murmurs gallops or arrhythmias. Abdomen is soft without masses or tenderness. No abdominal wall weakness could be felt when he was sitting upward. Normal male external genitalia with testes descended. No hernias. Digital rectal exam reveals prostate gland to be essentially normal in size. Contour are normal. Extremities unremarkable. Skin unremarkable.  Assessment: Normal  physical examination Hypertension, well-controlled Abdominal wall pain History of respiratory allergies History of low back pains  Plan: Symptomatic care of his back and abdominal wall  Return yearly or as needed Form completed

## 2015-02-17 IMAGING — CR DG FOREARM 2V*R*
2 series · 2 of 2 positions shown · non-contrast
Comparison: None.

CLINICAL DATA: Pain post trauma

EXAM:
RIGHT FOREARM - 2 VIEW

[x forearm ap right]
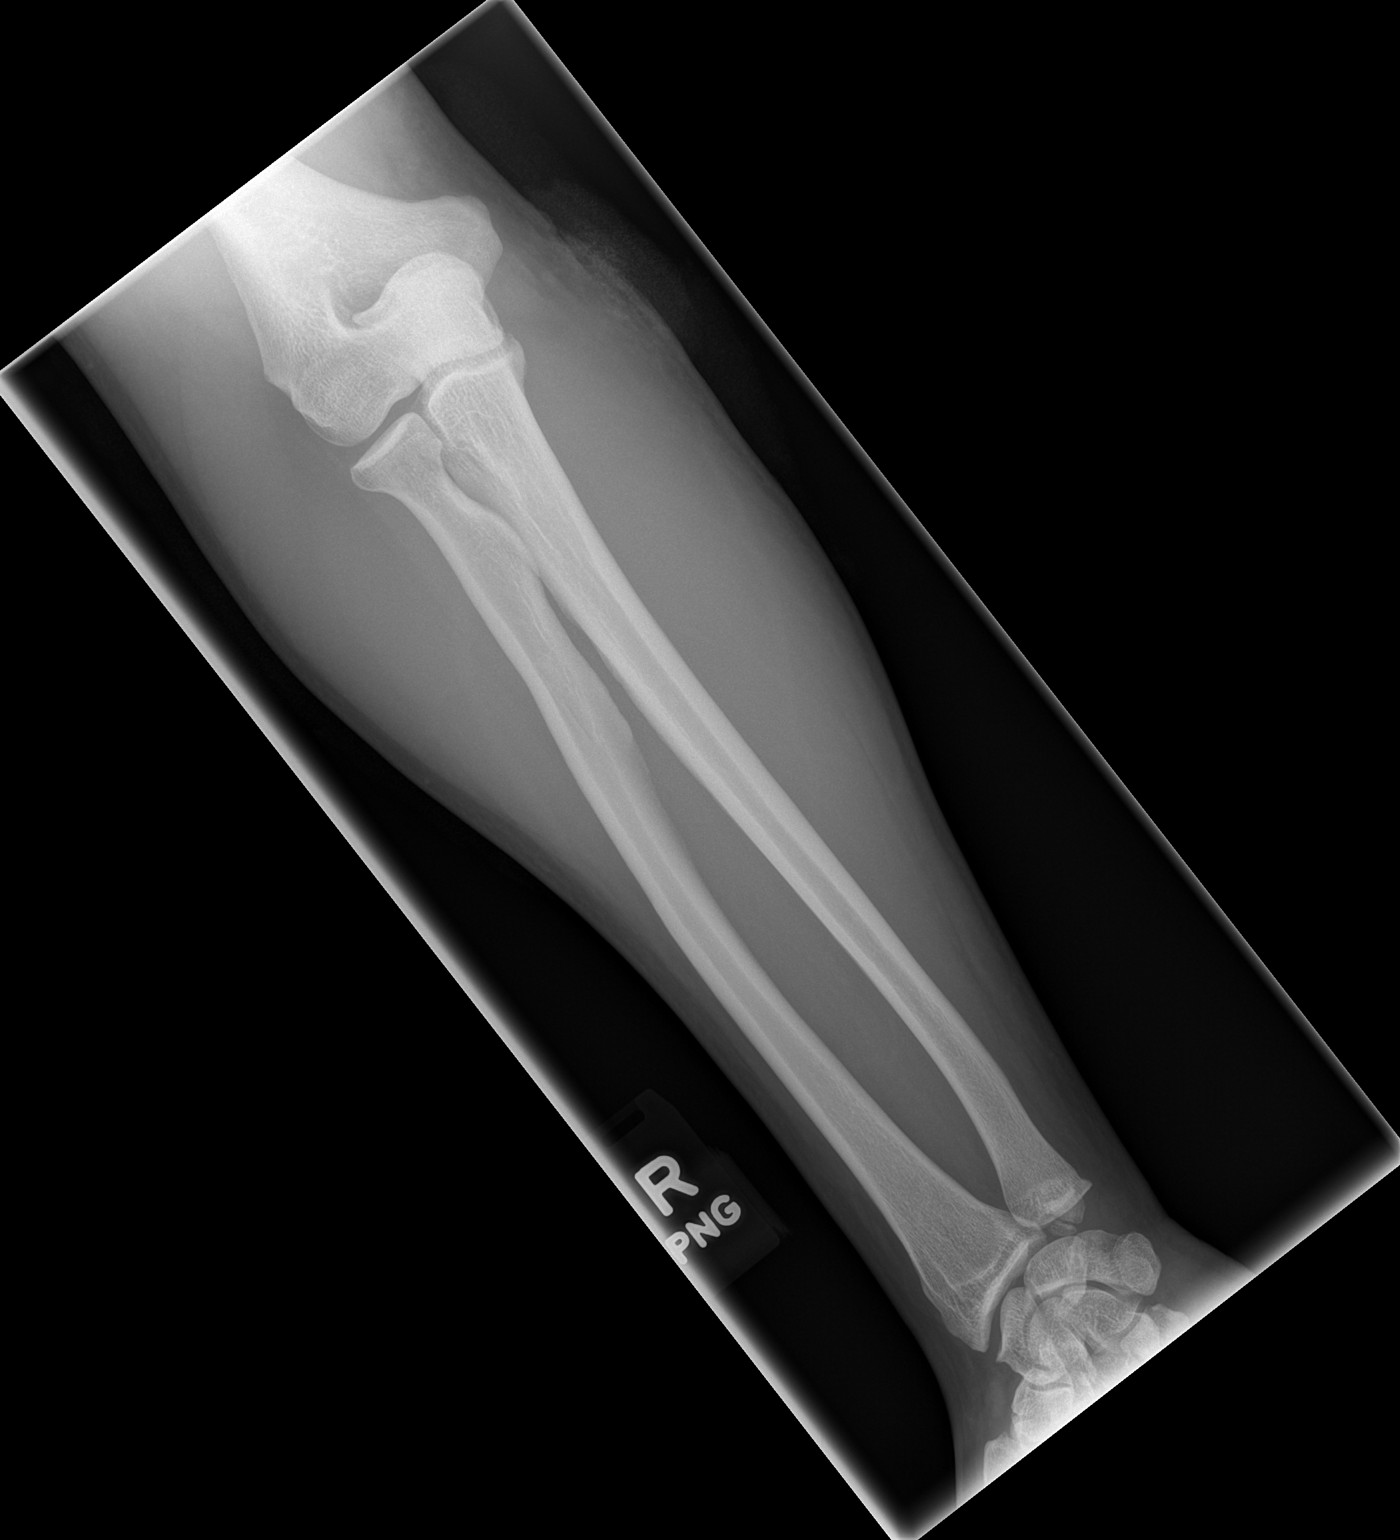

[x forearm lat right]
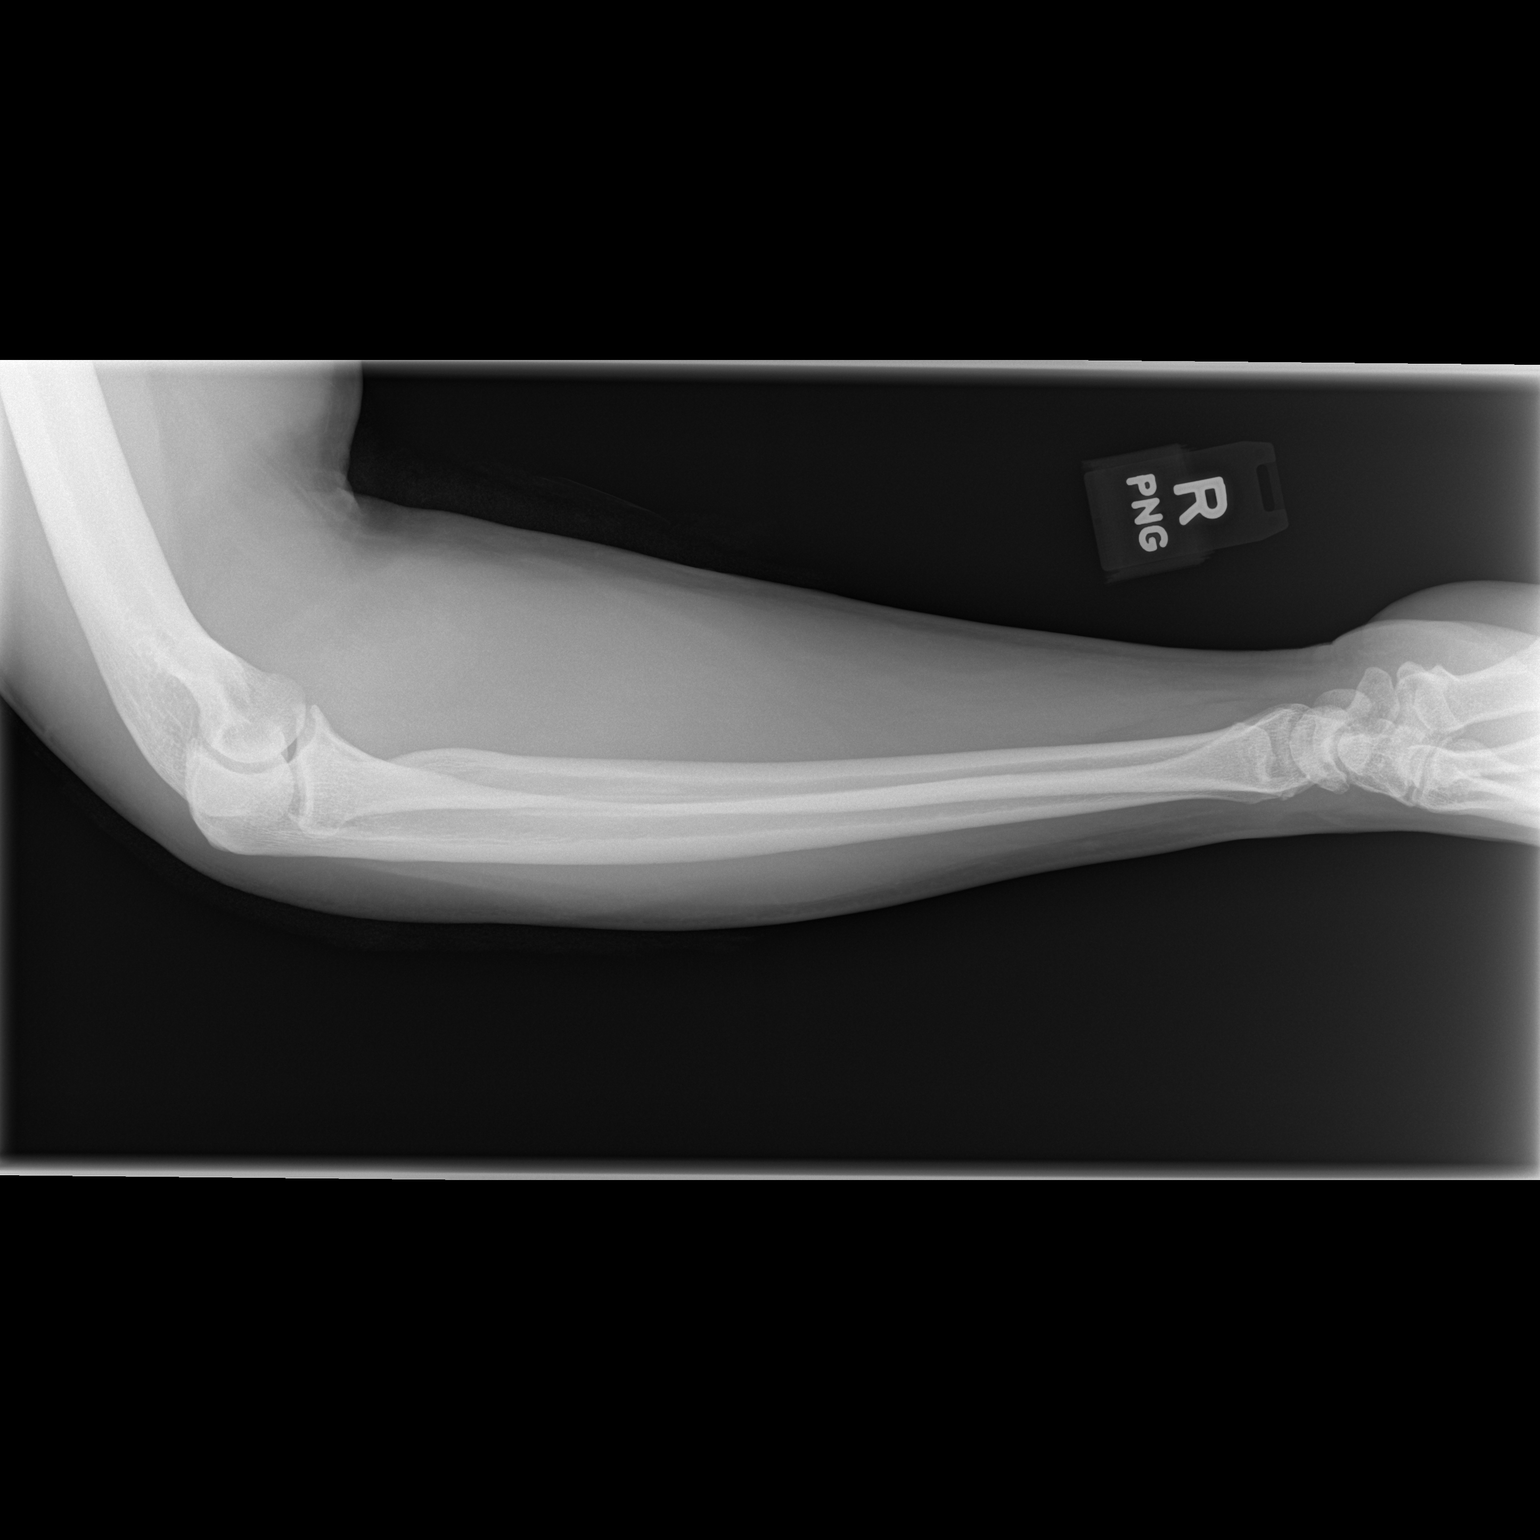

[2 of 2 positions shown; findings below may reference images not displayed]

FINDINGS: Frontal and lateral views were obtained. There is soft tissue injury
at the level of the elbow joint medially. No radiopaque foreign body
identified beyond overlying bandage. No fracture or dislocation.
Joint spaces appear intact.
IMPRESSION: No fracture or dislocation. No radiopaque foreign body beyond
overlying bandage.

## 2015-07-05 ENCOUNTER — Emergency Department (HOSPITAL_COMMUNITY): Payer: BC Managed Care – PPO

## 2015-07-05 ENCOUNTER — Emergency Department (HOSPITAL_COMMUNITY)
Admission: EM | Admit: 2015-07-05 | Discharge: 2015-07-05 | Disposition: A | Discharge status: Home / Self Care | Payer: BC Managed Care – PPO | Attending: Emergency Medicine | Admitting: Emergency Medicine

## 2015-07-05 ENCOUNTER — Encounter (HOSPITAL_COMMUNITY): Payer: Self-pay | Admitting: Nurse Practitioner

## 2015-07-05 DIAGNOSIS — R319 Hematuria, unspecified: Secondary | ICD-10-CM | POA: Insufficient documentation

## 2015-07-05 DIAGNOSIS — Z79899 Other long term (current) drug therapy: Secondary | ICD-10-CM | POA: Diagnosis not present

## 2015-07-05 DIAGNOSIS — Z7951 Long term (current) use of inhaled steroids: Secondary | ICD-10-CM | POA: Diagnosis not present

## 2015-07-05 DIAGNOSIS — I10 Essential (primary) hypertension: Secondary | ICD-10-CM | POA: Insufficient documentation

## 2015-07-05 DIAGNOSIS — R1011 Right upper quadrant pain: Secondary | ICD-10-CM

## 2015-07-05 LAB — COMPREHENSIVE METABOLIC PANEL
ALT: 28 U/L (ref 17–63)
AST: 33 U/L (ref 15–41)
Albumin: 4 g/dL (ref 3.5–5.0)
Alkaline Phosphatase: 79 U/L (ref 38–126)
Anion gap: 8 (ref 5–15)
BUN: 14 mg/dL (ref 6–20)
CO2: 27 mmol/L (ref 22–32)
Calcium: 9.2 mg/dL (ref 8.9–10.3)
Chloride: 101 mmol/L (ref 101–111)
Creatinine, Ser: 1.26 mg/dL — ABNORMAL HIGH (ref 0.61–1.24)
GFR calc Af Amer: >60 mL/min (ref >60)
GFR calc non Af Amer: >60 mL/min (ref >60)
Glucose, Bld: 110 mg/dL — ABNORMAL HIGH (ref 65–99)
Potassium: 4.5 mmol/L (ref 3.5–5.1)
Sodium: 136 mmol/L (ref 135–145)
Total Bilirubin: 1.2 mg/dL (ref 0.3–1.2)
Total Protein: 7.3 g/dL (ref 6.5–8.1)

## 2015-07-05 LAB — URINALYSIS, ROUTINE W REFLEX MICROSCOPIC
Bilirubin Urine: NEGATIVE
Glucose, UA: NEGATIVE mg/dL
Ketones, ur: NEGATIVE mg/dL
Leukocytes, UA: NEGATIVE
Nitrite: NEGATIVE
Protein, ur: NEGATIVE mg/dL
Specific Gravity, Urine: 1.011 (ref 1.005–1.030)
Urobilinogen, UA: 0.2 mg/dL (ref 0.0–1.0)
pH: 5 (ref 5.0–8.0)

## 2015-07-05 LAB — CBC WITH DIFFERENTIAL/PLATELET
Basophils Absolute: 0 10*3/uL (ref 0.0–0.1)
Basophils Relative: 0 % (ref 0–1)
Eosinophils Absolute: 0 10*3/uL (ref 0.0–0.7)
Eosinophils Relative: 0 % (ref 0–5)
HCT: 39.5 % (ref 39.0–52.0)
Hemoglobin: 13.1 g/dL (ref 13.0–17.0)
Lymphocytes Relative: 12 % (ref 12–46)
Lymphs Abs: 1.3 10*3/uL (ref 0.7–4.0)
MCH: 28.5 pg (ref 26.0–34.0)
MCHC: 33.2 g/dL (ref 30.0–36.0)
MCV: 86.1 fL (ref 78.0–100.0)
Monocytes Absolute: 0.5 10*3/uL (ref 0.1–1.0)
Monocytes Relative: 5 % (ref 3–12)
Neutro Abs: 9.6 10*3/uL — ABNORMAL HIGH (ref 1.7–7.7)
Neutrophils Relative %: 83 % — ABNORMAL HIGH (ref 43–77)
Platelets: 213 10*3/uL (ref 150–400)
RBC: 4.59 MIL/uL (ref 4.22–5.81)
RDW: 13.9 % (ref 11.5–15.5)
WBC: 11.5 10*3/uL — ABNORMAL HIGH (ref 4.0–10.5)

## 2015-07-05 LAB — URINE MICROSCOPIC-ADD ON

## 2015-07-05 LAB — LIPASE, BLOOD: LIPASE: 25 U/L (ref 22–51)

## 2015-07-05 LAB — I-STAT TROPONIN, ED: Troponin i, poc: 0 ng/mL (ref 0.00–0.08)

## 2015-07-05 LAB — TROPONIN I

## 2015-07-05 MED ORDER — DICYCLOMINE HCL 20 MG PO TABS: 20.0000 mg | ORAL_TABLET | Freq: Two times a day (BID) | ORAL | Status: DC

## 2015-07-05 MED ORDER — ONDANSETRON HCL 4 MG PO TABS: 4.0000 mg | ORAL_TABLET | Freq: Four times a day (QID) | ORAL | Status: DC

## 2015-07-05 NOTE — ED Notes (Signed)
Vrinda NP at bedside.  

## 2015-07-05 NOTE — ED Provider Notes (Signed)
CSN: [id number]     Arrival date & time 07/05/15  1426 History   First MD Initiated Contact with Patient 07/05/15 1433     Chief Complaint  Patient presents with  . Abdominal Pain     (Consider location/radiation/quality/duration/timing/severity/associated sxs/prior Treatment) HPI Comments: Pt comes in with ruq pain that radiates to his back that started today. He states that he has a similar episode a couple of weeks ago that resolved on its own. No fever. He had some etoh yesterday. He was sent over from urgent care. The morphine resolved the pain. Vomiting and nausea.  The history is provided by the patient. No language interpreter was used.    Past Medical History  Diagnosis Date  . Allergy   . Hypertension    History reviewed. No pertinent past surgical history. Family History  Problem Relation Age of Onset  . Hypertension Mother   . Diabetes Mother   . Hypertension Father   . Hypertension Maternal Grandmother   . Hypertension Maternal Grandfather   . Hypertension Paternal Grandmother   . Hypertension Paternal Grandfather    History  Substance Use Topics  . Smoking status: Never Smoker   . Smokeless tobacco: Not on file  . Alcohol Use: 0.0 oz/week    0 Standard drinks or equivalent per week     Comment: occasionally     Review of Systems  All other systems reviewed and are negative.     Allergies  Review of patient's allergies indicates no known allergies.  Home Medications   Prior to Admission medications   Medication Sig Start Date End Date Taking? Authorizing Provider  lisinopril-hydrochlorothiazide (PRINZIDE,ZESTORETIC) 20-12.5 MG per tablet Take one daily for blood pressure 12/27/14  Yes Carmelina Dane, MD  Multiple Vitamin (MULTIVITAMIN WITH MINERALS) TABS tablet Take 1 tablet by mouth daily.   Yes Historical Provider, MD  fluticasone (FLONASE) 50 MCG/ACT nasal spray Place 2 sprays into the nose daily. Patient not taking: Reported on 12/27/2014  09/19/12   Collene Gobble, MD  hydrochlorothiazide (HYDRODIURIL) 12.5 MG tablet Take 1 tablet (12.5 mg total) by mouth daily. Patient not taking: Reported on 12/27/2014 09/19/12   Collene Gobble, MD  HYDROcodone-acetaminophen (NORCO/VICODIN) 5-325 MG per tablet Take 1 tablet by mouth every 6 (six) hours as needed for pain. Patient not taking: Reported on 12/27/2014 08/20/13   Raymon Mutton Dunn, PA-C   BP 108/50 mmHg  Pulse 71  Temp(Src) 98.5 F (36.9 C) (Oral)  Resp 19  SpO2 98% Physical Exam  Constitutional: He is oriented to person, place, and time. He appears well-developed and well-nourished.  HENT:  Head: Atraumatic.  Cardiovascular: Normal rate and regular rhythm.   Pulmonary/Chest: Effort normal and breath sounds normal.  Abdominal: Soft. Bowel sounds are normal.  Musculoskeletal: Normal range of motion.  Neurological: He is alert and oriented to person, place, and time.  Skin: Skin is warm and dry.  Psychiatric: He has a normal mood and affect.  Nursing note and vitals reviewed.   ED Course  Procedures (including critical care time) Labs Review Labs Reviewed  CBC WITH DIFFERENTIAL/PLATELET - Abnormal; Notable for the following:    WBC 11.5 (*)    Neutrophils Relative % 83 (*)    Neutro Abs 9.6 (*)    All other components within normal limits  COMPREHENSIVE METABOLIC PANEL - Abnormal; Notable for the following:    Glucose, Bld 110 (*)    Creatinine, Ser 1.26 (*)    All other components within  normal limits  URINALYSIS, ROUTINE W REFLEX MICROSCOPIC (NOT AT [facility]) - Abnormal; Notable for the following:    Hgb urine dipstick LARGE (*)    All other components within normal limits  URINE MICROSCOPIC-ADD ON - Abnormal; Notable for the following:    Squamous Epithelial / LPF FEW (*)    All other components within normal limits  LIPASE, BLOOD  TROPONIN I  I-STAT TROPOININ, ED    Imaging Review No results found.  ED ECG REPORT   Date: 07/05/2015  Rate: 69  Rhythm: normal  sinus rhythm  QRS Axis: normal  Intervals: normal  ST/T Wave abnormalities: early repolarization  Conduction Disutrbances:none  Narrative Interpretation:   Old EKG Reviewed: none available  I have personally reviewed the EKG tracing and agree with the computerized printout as noted.   MDM   Final diagnoses:  RUQ pain    [place] pending. [facility]    Teressa LowerVrinda Ledger Heindl, NP 07/05/15 1659  Benjiman CoreNathan Anakaren Campion, MD 07/07/15 [phone]

## 2015-07-05 NOTE — ED Provider Notes (Signed)
Hunter Adams is a 45 y.o. male presenting with RUQ abdominal pain that radiates to back. No fever. + nausea and vomiting.  No vomiting in ED. Leukocytosis of 11.5 no fevers. No electrolyte abnormalities. Elevated creatinine and hematuria new. Pt given morphine and denies pain.  Plan for ultrasound for RUQ tenderness.  Blood pressure 112/68, pulse 65, temperature 98.5 F (36.9 C), temperature source Oral, resp. rate 19, SpO2 96 %. RRR CTAB Abdomen nontender on exam. + BS. No evidence of peritonitis  Results for orders placed or performed during the hospital encounter of 07/05/15  CBC with Differential  Result Value Ref Range   WBC 11.5 (H) 4.0 - 10.5 K/uL   RBC 4.59 4.22 - 5.81 MIL/uL   Hemoglobin 13.1 13.0 - 17.0 g/dL   HCT 16.1 09.6 - 04.5 %   MCV 86.1 78.0 - 100.0 fL   MCH 28.5 26.0 - 34.0 pg   MCHC 33.2 30.0 - 36.0 g/dL   RDW 40.9 81.1 - 91.4 %   Platelets 213 150 - 400 K/uL   Neutrophils Relative % 83 (H) 43 - 77 %   Neutro Abs 9.6 (H) 1.7 - 7.7 K/uL   Lymphocytes Relative 12 12 - 46 %   Lymphs Abs 1.3 0.7 - 4.0 K/uL   Monocytes Relative 5 3 - 12 %   Monocytes Absolute 0.5 0.1 - 1.0 K/uL   Eosinophils Relative 0 0 - 5 %   Eosinophils Absolute 0.0 0.0 - 0.7 K/uL   Basophils Relative 0 0 - 1 %   Basophils Absolute 0.0 0.0 - 0.1 K/uL  Comprehensive metabolic panel  Result Value Ref Range   Sodium 136 135 - 145 mmol/L   Potassium 4.5 3.5 - 5.1 mmol/L   Chloride 101 101 - 111 mmol/L   CO2 27 22 - 32 mmol/L   Glucose, Bld 110 (H) 65 - 99 mg/dL   BUN 14 6 - 20 mg/dL   Creatinine, Ser 7.82 (H) 0.61 - 1.24 mg/dL   Calcium 9.2 8.9 - 95.6 mg/dL   Total Protein 7.3 6.5 - 8.1 g/dL   Albumin 4.0 3.5 - 5.0 g/dL   AST 33 15 - 41 U/L   ALT 28 17 - 63 U/L   Alkaline Phosphatase 79 38 - 126 U/L   Total Bilirubin 1.2 0.3 - 1.2 mg/dL   GFR calc non Af Amer >60 >60 mL/min   GFR calc Af Amer >60 >60 mL/min   Anion gap 8 5 - 15  Lipase, blood  Result Value Ref Range   Lipase  25 22 - 51 U/L  Troponin I  Result Value Ref Range   Troponin I <0.03 <0.031 ng/mL  Urinalysis, Routine w reflex microscopic (not at [facility])  Result Value Ref Range   Color, Urine YELLOW YELLOW   APPearance CLEAR CLEAR   Specific Gravity, Urine 1.011 1.005 - 1.030   pH 5.0 5.0 - 8.0   Glucose, UA NEGATIVE NEGATIVE mg/dL   Hgb urine dipstick LARGE (A) NEGATIVE   Bilirubin Urine NEGATIVE NEGATIVE   Ketones, ur NEGATIVE NEGATIVE mg/dL   Protein, ur NEGATIVE NEGATIVE mg/dL   Urobilinogen, UA 0.2 0.0 - 1.0 mg/dL   Nitrite NEGATIVE NEGATIVE   Leukocytes, UA NEGATIVE NEGATIVE  Urine microscopic-add on  Result Value Ref Range   Squamous Epithelial / LPF FEW (A) RARE   WBC, UA 0-2 <3 WBC/hpf   RBC / HPF 11-20 <3 RBC/hpf   Urine-Other MUCOUS PRESENT   I-stat troponin, ED (  only if pt is 47 y.o. or older & pain is above umbilicus)  [facility]  Result Value Ref Range   Troponin i, poc 0.00 0.00 - 0.08 ng/mL   Comment 3           [place] Abdomen Complete  07/05/2015   CLINICAL DATA:  Right upper quadrant pain.  EXAM: ULTRASOUND ABDOMEN COMPLETE  COMPARISON:  None.  FINDINGS: Gallbladder: No gallstones or wall thickening visualized. No sonographic Murphy sign noted.  Common bile duct: Diameter: 3 mm.  Liver: No focal lesion identified. Within normal limits in parenchymal echogenicity.  IVC: No abnormality visualized.  Pancreas: Visualized portion unremarkable.  Spleen: Size and appearance within normal limits.  Right Kidney: Length: 11.4 cm. Echogenicity within normal limits. No mass or hydronephrosis visualized.  Left Kidney: Length: 11.4 cm. Echogenicity within normal limits. No mass or hydronephrosis visualized.  Abdominal aorta: No aneurysm visualized.  Other findings: None.  IMPRESSION: Negative. No evidence of cholelithiasis or other significant abnormality.   Electronically Signed   By: Myles RosenthalJohn  Stahl M.D.   On: 07/05/2015 17:14    Meds given in ED:  Medications - No data to  display  Discharge Medication List as of 07/05/2015  5:50 PM    START taking these medications   Details  dicyclomine (BENTYL) 20 MG tablet Take 1 tablet (20 mg total) by mouth 2 (two) times daily., Starting 07/05/2015, Until Discontinued, Print    ondansetron (ZOFRAN) 4 MG tablet Take 1 tablet (4 mg total) by mouth every 6 (six) hours., Starting 07/05/2015, Until Discontinued, Print          MDM  1. Hematuria   2. RUQ pain     Pt presenting with RUQ pain. No pain on my exam. VSS. Pt well appearing no distress. No nausea or active vomiting in ED. US abdominal without acute abnormalities. No cholecystitis, hydronephrosis. Pt with hematuria. Pt to follow up with PCP for recheck. Pt given referral to GI for persistent abdominal pain. Patient is afebrile, nontoxic, and in no acute distress. Patient is appropriate for outpatient management and is stable for discharge.  Discussed return precautions with patient. Discussed all results and patient verbalizes understanding and agrees with plan.  Filed Vitals:   07/05/15 1515 07/05/15 1600 07/05/15 1715 07/05/15 1755  BP: 118/66 112/68 113/75 113/75  Pulse: 63 65 63 74  Temp:    98.5 F (36.9 C)  TempSrc:    Oral  Resp: 17 19 22 22   SpO2: 98% 96% 95% 99%        Oswaldo ConroyVictoria Chardonay Scritchfield, PA-C 07/05/15 1813  Elwin MochaBlair Walden, MD 07/06/15 [phone]

## 2015-07-05 NOTE — Discharge Instructions (Signed)
Return to the emergency room with worsening of symptoms, new symptoms or with symptoms that are concerning , especially fevers, abdominal pain in one area, unable to keep down fluids, blood in stool or vomit, severe pain, you feel faint, lightheaded or pass out. Start taking metamucil daily. BRAT diet: bananas, rice, applesauce, toast Please call your doctor for a followup appointment within 24-48 hours. When you talk to your doctor please let them know that you were seen in the emergency department and have them acquire all of your records so that they can discuss the findings with you and formulate a treatment plan to fully care for your new and ongoing problems.  Follow up with GI for persistent abdominal pain. Read below information and follow recommendations.  Abdominal Pain Many things can cause abdominal pain. Usually, abdominal pain is not caused by a disease and will improve without treatment. It can often be observed and treated at home. Your health care provider will do a physical exam and possibly order blood tests and X-rays to help determine the seriousness of your pain. However, in many cases, more time must pass before a clear cause of the pain can be found. Before that point, your health care provider may not know if you need more testing or further treatment. HOME CARE INSTRUCTIONS  Monitor your abdominal pain for any changes. The following actions may help to alleviate any discomfort you are experiencing:  Only take over-the-counter or prescription medicines as directed by your health care provider.  Do not take laxatives unless directed to do so by your health care provider.  Try a clear liquid diet (broth, tea, or water) as directed by your health care provider. Slowly move to a bland diet as tolerated. SEEK MEDICAL CARE IF:  You have unexplained abdominal pain.  You have abdominal pain associated with nausea or diarrhea.  You have pain when you urinate or have a bowel  movement.  You experience abdominal pain that wakes you in the night.  You have abdominal pain that is worsened or improved by eating food.  You have abdominal pain that is worsened with eating fatty foods.  You have a fever. SEEK IMMEDIATE MEDICAL CARE IF:   Your pain does not go away within 2 hours.  You keep throwing up (vomiting).  Your pain is felt only in portions of the abdomen, such as the right side or the left lower portion of the abdomen.  You pass bloody or black tarry stools. MAKE SURE YOU:  Understand these instructions.   Will watch your condition.   Will get help right away if you are not doing well or get worse.  Document Released: 09/26/2005 Document Revised: 12/22/2013 Document Reviewed: 08/26/2013 [facility] Patient Information 2015 [employer] [place]. This information is not intended to replace advice given to you by your health care provider. Make sure you discuss any questions you have with your health care provider.

## 2015-07-05 NOTE — ED Notes (Signed)
[facility]  [facility] dull went up to 10/10  Upper abdominal pain x1-2 weeks. Pt EKG- LVH, and V1-5 shows some ST elevation- cleared by Rubin PayorPickering MD. Pt endorses nausea and vomiting and pain radiation to lower back started today.   Has had 324 ASA, 1 nitro- with no relief  Pt then given 4mg  of IV morphine with complete relief.

## 2015-12-12 ENCOUNTER — Other Ambulatory Visit: Payer: Self-pay | Admitting: Emergency Medicine

## 2015-12-14 ENCOUNTER — Other Ambulatory Visit: Payer: Self-pay | Admitting: Emergency Medicine

## 2016-02-28 ENCOUNTER — Ambulatory Visit (INDEPENDENT_AMBULATORY_CARE_PROVIDER_SITE_OTHER): Payer: BC Managed Care – PPO | Admitting: Urgent Care

## 2016-02-28 VITALS — BP 133/82 | HR 63 | Temp 98.7°F | Resp 17 | Ht 70.5 in | Wt 240.0 lb

## 2016-02-28 DIAGNOSIS — Z9109 Other allergy status, other than to drugs and biological substances: Secondary | ICD-10-CM

## 2016-02-28 DIAGNOSIS — Z23 Encounter for immunization: Secondary | ICD-10-CM | POA: Diagnosis not present

## 2016-02-28 DIAGNOSIS — I10 Essential (primary) hypertension: Secondary | ICD-10-CM | POA: Diagnosis not present

## 2016-02-28 DIAGNOSIS — Z91048 Other nonmedicinal substance allergy status: Secondary | ICD-10-CM | POA: Diagnosis not present

## 2016-02-28 DIAGNOSIS — M545 Low back pain: Secondary | ICD-10-CM | POA: Diagnosis not present

## 2016-02-28 DIAGNOSIS — Z0000 Encounter for general adult medical examination without abnormal findings: Secondary | ICD-10-CM

## 2016-02-28 LAB — COMPLETE METABOLIC PANEL WITH GFR
ALBUMIN: 4.5 g/dL (ref 3.6–5.1)
ALT: 20 U/L (ref 9–46)
AST: 24 U/L (ref 10–40)
Alkaline Phosphatase: 94 U/L (ref 40–115)
BUN: 12 mg/dL (ref 7–25)
CALCIUM: 9.6 mg/dL (ref 8.6–10.3)
CO2: 28 mmol/L (ref 20–31)
Chloride: 103 mmol/L (ref 98–110)
Creat: 1.21 mg/dL (ref 0.60–1.35)
GFR, Est African American: 82 mL/min (ref >=60)
GFR, Est Non African American: 71 mL/min (ref >=60)
Glucose, Bld: 105 mg/dL — ABNORMAL HIGH (ref 65–99)
POTASSIUM: 4.2 mmol/L (ref 3.5–5.3)
Sodium: 142 mmol/L (ref 135–146)
Total Bilirubin: 0.5 mg/dL (ref 0.2–1.2)
Total Protein: 7.6 g/dL (ref 6.1–8.1)

## 2016-02-28 LAB — POCT URINALYSIS DIP (MANUAL ENTRY)
Bilirubin, UA: NEGATIVE
GLUCOSE UA: NEGATIVE
Ketones, POC UA: NEGATIVE
Leukocytes, UA: NEGATIVE
NITRITE UA: NEGATIVE
Spec Grav, UA: 1.02
UROBILINOGEN UA: 1
pH, UA: 6

## 2016-02-28 LAB — CBC
HCT: 41.4 % (ref 39.0–52.0)
Hemoglobin: 13.9 g/dL (ref 13.0–17.0)
MCH: 28.4 pg (ref 26.0–34.0)
MCHC: 33.6 g/dL (ref 30.0–36.0)
MCV: 84.7 fL (ref 78.0–100.0)
MPV: 9.5 fL (ref 8.6–12.4)
Platelets: 205 10*3/uL (ref 150–400)
RBC: 4.89 MIL/uL (ref 4.22–5.81)
RDW: 13.9 % (ref 11.5–15.5)
WBC: 6.5 10*3/uL (ref 4.0–10.5)

## 2016-02-28 LAB — LIPID PANEL
CHOL/HDL RATIO: 5.3 ratio — AB (ref <=5.0)
CHOLESTEROL: 190 mg/dL (ref 125–200)
HDL: 36 mg/dL — ABNORMAL LOW (ref >=40)
LDL Cholesterol: 136 mg/dL — ABNORMAL HIGH (ref <130)
Triglycerides: 91 mg/dL (ref <150)
VLDL: 18 mg/dL (ref <30)

## 2016-02-28 LAB — TSH: TSH: 1.53 mIU/L (ref 0.40–4.50)

## 2016-02-28 MED ORDER — LISINOPRIL-HYDROCHLOROTHIAZIDE 20-12.5 MG PO TABS: 1.0000 | ORAL_TABLET | Freq: Every day | ORAL | Status: DC

## 2016-02-28 MED ORDER — CYCLOBENZAPRINE HCL 5 MG PO TABS: 5.0000 mg | ORAL_TABLET | Freq: Every day | ORAL | Status: DC

## 2016-02-28 NOTE — Patient Instructions (Addendum)
Keeping you healthy  Get these tests  Blood pressure- Have your blood pressure checked once a year by your healthcare provider.  Normal blood pressure is 120/80.  Weight- Have your body mass index (BMI) calculated to screen for obesity.  BMI is a measure of body fat based on height and weight. You can also calculate your own BMI at https://www.west-esparza.com/.  Cholesterol- Have your cholesterol checked regularly starting at age 48, sooner may be necessary if you have diabetes, high blood pressure, if a family member developed heart diseases at an early age or if you smoke.   Chlamydia, HIV, and other sexual transmitted disease- Get screened each year until the age of 29 then within three months of each new sexual partner.  Diabetes- Have your blood sugar checked regularly if you have high blood pressure, high cholesterol, a family history of diabetes or if you are overweight.  Get these vaccines  Flu shot- Every fall.  Tetanus shot- Every 10 years.  Menactra- Single dose; prevents meningitis.  Take these steps  Don't smoke- If you do smoke, ask your healthcare provider about quitting. For tips on how to quit, go to www.smokefree.gov or call 1-800-QUIT-NOW.  Be physically active- Exercise 5 days a week for at least 30 minutes.  If you are not already physically active start slow and gradually work up to 30 minutes of moderate physical activity.  Examples of moderate activity include walking briskly, mowing the yard, dancing, swimming bicycling, etc.  Eat a healthy diet- Eat a variety of healthy foods such as fruits, vegetables, low fat milk, low fat cheese, yogurt, lean meats, poultry, fish, beans, tofu, etc.  For more information on healthy eating, go to www.thenutritionsource.org  Drink alcohol in moderation- Limit alcohol intake two drinks or less a day.  Never drink and drive.  Dentist- Brush and floss teeth twice daily; visit your dentis twice a year.  Depression-Your emotional  health is as important as your physical health.  If you're feeling down, losing interest in things you normally enjoy please talk with your healthcare provider.  Gun Safety- If you keep a gun in your home, keep it unloaded and with the safety lock on.  Bullets should be stored separately.  Helmet use- Always wear a helmet when riding a motorcycle, bicycle, rollerblading or skateboarding.  Safe sex- If you may be exposed to a sexually transmitted infection, use a condom  Seat belts- Seat bels can save your life; always wear one.  Smoke/Carbon Monoxide detectors- These detectors need to be installed on the appropriate level of your home.  Replace batteries at least once a year.  Skin Cancer- When out in the sun, cover up and use sunscreen SPF 15 or higher.  Violence- If anyone is threatening or hurting you, please tell your healthcare provider.   Hypertension Hypertension, commonly called high blood pressure, is when the force of blood pumping through your arteries is too strong. Your arteries are the blood vessels that carry blood from your heart throughout your body. A blood pressure reading consists of a higher number over a lower number, such as 110/72. The higher number (systolic) is the pressure inside your arteries when your heart pumps. The lower number (diastolic) is the pressure inside your arteries when your heart relaxes. Ideally you want your blood pressure below 120/80. Hypertension forces your heart to work harder to pump blood. Your arteries may become narrow or stiff. Having untreated or uncontrolled hypertension can cause heart attack, stroke, kidney disease, and other  other problems. RISK FACTORS Some risk factors for high blood pressure are controllable. Others are not.  Risk factors you cannot control include:   Race. You may be at higher risk if you are African American.  Age. Risk increases with age.  Gender. Men are at higher risk than women before age 45 years. After  age 65, women are at higher risk than men. Risk factors you can control include:  Not getting enough exercise or physical activity.  Being overweight.  Getting too much fat, sugar, calories, or salt in your diet.  Drinking too much alcohol. SIGNS AND SYMPTOMS Hypertension does not usually cause signs or symptoms. Extremely high blood pressure (hypertensive crisis) may cause headache, anxiety, shortness of breath, and nosebleed. DIAGNOSIS To check if you have hypertension, your health care provider will measure your blood pressure while you are seated, with your arm held at the level of your heart. It should be measured at least twice using the same arm. Certain conditions can cause a difference in blood pressure between your right and left arms. A blood pressure reading that is higher than normal on one occasion does not mean that you need treatment. If it is not clear whether you have high blood pressure, you may be asked to return on a different day to have your blood pressure checked again. Or, you may be asked to monitor your blood pressure at home for 1 or more weeks. TREATMENT Treating high blood pressure includes making lifestyle changes and possibly taking medicine. Living a healthy lifestyle can help lower high blood pressure. You may need to change some of your habits. Lifestyle changes may include:  Following the DASH diet. This diet is high in fruits, vegetables, and whole grains. It is low in salt, red meat, and added sugars.  Keep your sodium intake below 2,300 mg per day.  Getting at least 30-45 minutes of aerobic exercise at least 4 times per week.  Losing weight if necessary.  Not smoking.  Limiting alcoholic beverages.  Learning ways to reduce stress. Your health care provider may prescribe medicine if lifestyle changes are not enough to get your blood pressure under control, and if one of the following is true:  You are 18-59 years of age and your systolic blood  pressure is above 140.  You are 60 years of age or older, and your systolic blood pressure is above 150.  Your diastolic blood pressure is above 90.  You have diabetes, and your systolic blood pressure is over 140 or your diastolic blood pressure is over 90.  You have kidney disease and your blood pressure is above 140/90.  You have heart disease and your blood pressure is above 140/90. Your personal target blood pressure may vary depending on your medical conditions, your age, and other factors. HOME CARE INSTRUCTIONS  Have your blood pressure rechecked as directed by your health care provider.   Take medicines only as directed by your health care provider. Follow the directions carefully. Blood pressure medicines must be taken as prescribed. The medicine does not work as well when you skip doses. Skipping doses also puts you at risk for problems.  Do not smoke.   Monitor your blood pressure at home as directed by your health care provider. SEEK MEDICAL CARE IF:   You think you are having a reaction to medicines taken.  You have recurrent headaches or feel dizzy.  You have swelling in your ankles.  You have trouble with your vision. SEEK IMMEDIATE   CARE IF:  You develop a severe headache or confusion.  You have unusual weakness, numbness, or feel faint.  You have severe chest or abdominal pain.  You vomit repeatedly.  You have trouble breathing. MAKE SURE YOU:   Understand these instructions.  Will watch your condition.  Will get help right away if you are not doing well or get worse.   This information is not intended to replace advice given to you by your health care provider. Make sure you discuss any questions you have with your health care provider.   Document Released: 12/17/2005 Document Revised: 05/03/2015 Document Reviewed: 10/09/2013 Elsevier Interactive Patient Education 2016 [employer]    Back Pain, Adult Back pain is very common in  adults.The cause of back pain is rarely dangerous and the pain often gets better over time.The cause of your back pain may not be known. Some common causes of back pain include:  Strain of the muscles or ligaments supporting the spine.  Wear and tear (degeneration) of the spinal disks.  Arthritis.  Direct injury to the back. For many people, back pain may return. Since back pain is rarely dangerous, most people can learn to manage this condition on their own. HOME CARE INSTRUCTIONS Watch your back pain for any changes. The following actions may help to lessen any discomfort you are feeling:  Remain active. It is stressful on your back to sit or stand in one place for long periods of time. Do not sit, drive, or stand in one place for more than 30 minutes at a time. Take short walks on even surfaces as soon as you are able.Try to increase the length of time you walk each day.  Exercise regularly as directed by your health care provider. Exercise helps your back heal faster. It also helps avoid future injury by keeping your muscles strong and flexible.  Do not stay in bed.Resting more than 1-2 days can delay your recovery.  Pay attention to your body when you bend and lift. The most comfortable positions are those that put less stress on your recovering back. Always use proper lifting techniques, including:  Bending your knees.  Keeping the load close to your body.  Avoiding twisting.  Find a comfortable position to sleep. Use a firm mattress and lie on your side with your knees slightly bent. If you lie on your back, put a pillow under your knees.  Avoid feeling anxious or stressed.Stress increases muscle tension and can worsen back pain.It is important to recognize when you are anxious or stressed and learn ways to manage it, such as with exercise.  Take medicines only as directed by your health care provider. Over-the-counter medicines to reduce pain and inflammation are often  the most helpful.Your health care provider may prescribe muscle relaxant drugs.These medicines help dull your pain so you can more quickly return to your normal activities and healthy exercise.  Apply ice to the injured area:  Put ice in a plastic bag.  Place a towel between your skin and the bag.  Leave the ice on for 20 minutes, 2-3 times a day for the first 2-3 days. After that, ice and heat may be alternated to reduce pain and spasms.  Maintain a healthy weight. Excess weight puts extra stress on your back and makes it difficult to maintain good posture. SEEK MEDICAL CARE IF:  You have pain that is not relieved with rest or medicine.  You have increasing pain going down into the legs or buttocks.  You have pain that does not improve in one week.  You have night pain.  You lose weight.  You have a fever or chills. SEEK IMMEDIATE MEDICAL CARE IF:   You develop new bowel or bladder control problems.  You have unusual weakness or numbness in your arms or legs.  You develop nausea or vomiting.  You develop abdominal pain.  You feel faint.   This information is not intended to replace advice given to you by your health care provider. Make sure you discuss any questions you have with your health care provider.   Document Released: 12/17/2005 Document Revised: 01/07/2015 Document Reviewed: 04/20/2014 Elsevier Interactive Patient Education [employer]

## 2016-02-28 NOTE — Progress Notes (Signed)
MRN: [id number]  Subjective:   Hunter Adams is a 46 y.o. male presenting for annual physical exam and medication refill.  Medical care team includes: PCP: No PCP Per Patient Vision: Has eye exam every, wears contacts. Dental: Gets dental cleanings every year, has one scheduled for next month. Specialists: None.   Patient is single, has 1 son in high school. 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 Denies smoking cigarettes. Has ~2 drinks of alcohol per week. Patient exercises regularly. Diet needs "work", tries to eat healthily.  Abdominal pain - reports several month history of epigastric pain, closely associated with exercise of his abs. Pain is cramp type sensation, last a few seconds. It has since decreased dramatically since he stopped doing sit ups. ROS as below. Also denies history of renal stones.   Back pain - reports ~4 year history of intermittent low back pain. Pain is soreness, tightness, does not radiate. His back pain is associated with lifting from his work with [employer]. Patient plans on replacing his mattress. Uses Biofreeze to massage his back with some relief.  Hunter Adams has HTN (hypertension) on his problem list.  Hunter Adams has a current medication list which includes the following prescription(s): lisinopril-hydrochlorothiazide and multivitamin with minerals. He has No Known Allergies.  Hunter Adams  has a past medical history of Allergy and Hypertension. Also  has no past surgical history on file.  His family history includes Diabetes in his mother; Hypertension in his father, maternal grandfather, maternal grandmother, mother, paternal grandfather, and paternal grandmother.  Immunizations: Flu shot today, last TDAP 06/17/2014.  Review of Systems  Constitutional: Negative for fever, chills, weight loss, malaise/fatigue and diaphoresis.  HENT: Negative for congestion, ear discharge, ear pain, hearing loss,  nosebleeds, sore throat and tinnitus.   Eyes: Negative for blurred vision, double vision, photophobia, pain, discharge and redness.  Respiratory: Negative for cough, shortness of breath and wheezing.   Cardiovascular: Negative for chest pain, palpitations and leg swelling.  Gastrointestinal: Positive for abdominal pain. Negative for nausea, vomiting, diarrhea, constipation and blood in stool.  Genitourinary: Negative for dysuria, urgency, frequency, hematuria and flank pain.  Musculoskeletal: Positive for back pain. Negative for myalgias and joint pain.  Skin: Negative for itching and rash.  Neurological: Negative for dizziness, tingling, seizures, loss of consciousness, weakness and headaches.  Endo/Heme/Allergies: Negative for polydipsia.  Psychiatric/Behavioral: Negative for depression, suicidal ideas, hallucinations, memory loss and substance abuse. The patient is not nervous/anxious and does not have insomnia.    Objective:   Vitals: BP 133/82 mmHg  Pulse 63  Temp(Src) 98.7 F (37.1 C) (Oral)  Resp 17  Ht 5' 10.5" (1.791 m)  Wt 240 lb (108.863 kg)  BMI 33.94 kg/m2  SpO2 93%  Physical Exam  Constitutional: He is oriented to person, place, and time. He appears well-developed and well-nourished.  HENT:  TM's intact bilaterally, no effusions or erythema. Nasal turbinates pink and moist, nasal passages patent. No sinus tenderness. Oropharynx clear, mucous membranes moist, dentition in good repair.  Eyes: Conjunctivae and EOM are normal. Pupils are equal, round, and reactive to light. Right eye exhibits no discharge. Left eye exhibits no discharge. No scleral icterus.  Neck: Normal range of motion. Neck supple. No thyromegaly present.  Cardiovascular: Normal rate, regular rhythm and intact distal pulses.  Exam reveals no gallop and no friction rub.   No murmur heard. Pulmonary/Chest: No stridor. No respiratory distress. He has no wheezes. He has no rales.  Abdominal: Soft. Bowel  sounds are normal. He exhibits no distension and no mass. There is no tenderness.  Musculoskeletal: Normal range of motion. He exhibits no edema or tenderness.  Lymphadenopathy:    He has no cervical adenopathy.  Neurological: He is alert and oriented to person, place, and time. He has normal reflexes. Coordination normal.  Skin: Skin is warm and dry. No rash noted. No erythema. No pallor.  Psychiatric: He has a normal mood and affect.   Results for orders placed or performed in visit on 02/28/16 (from the past 24 hour(s))  POCT urinalysis dipstick     Status: Abnormal   Collection Time: 02/28/16  8:42 AM  Result Value Ref Range   Color, UA yellow yellow   Clarity, UA clear clear   Glucose, UA negative negative   Bilirubin, UA negative negative   Ketones, POC UA negative negative   Spec Grav, UA 1.020    Blood, UA trace-lysed (A) negative   pH, UA 6.0    Protein Ur, POC trace (A) negative   Urobilinogen, UA 1.0    Nitrite, UA Negative Negative   Leukocytes, UA Negative Negative   Assessment and Plan :   1. Annual physical exam - Medically healthy - Discussed healthy lifestyle, diet, exercise, preventative care, vaccinations, and addressed patient's concerns.   2. Essential hypertension - Labs pending - Well-controlled, med refill provided. Continue with dietary modifications and exercise. RTC in 6 months.  3. Low back pain without sciatica, unspecified back pain laterality - Recommended back care while at work, exercising. Labs pending, use Flexeril as needed.  4. Environmental allergies - Recommended patient start oral antihistamine for better allergy control.  5. Need for prophylactic vaccination and inoculation against influenza - Flu Vaccine QUAD 36+ mos IM   Wallis Bamberg, PA-C [facility] [phone] 02/28/2016  8:22 AM

## 2016-02-29 LAB — MICROALBUMIN, URINE: Microalb, Ur: 4.9 mg/dL

## 2017-01-06 IMAGING — US US ABDOMEN COMPLETE
1 series · 14 of 25 positions shown · non-contrast
Comparison: None.

CLINICAL DATA: Right upper quadrant pain.

EXAM:
ULTRASOUND ABDOMEN COMPLETE

[Series 1: us abdomen complete · 0.22mm/px · 14 of 82 slices shown]
[im 1/82]
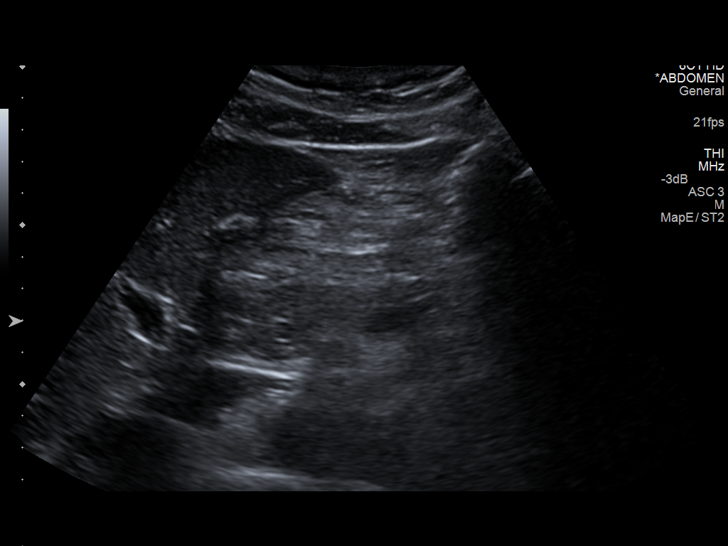
[im 7/82]
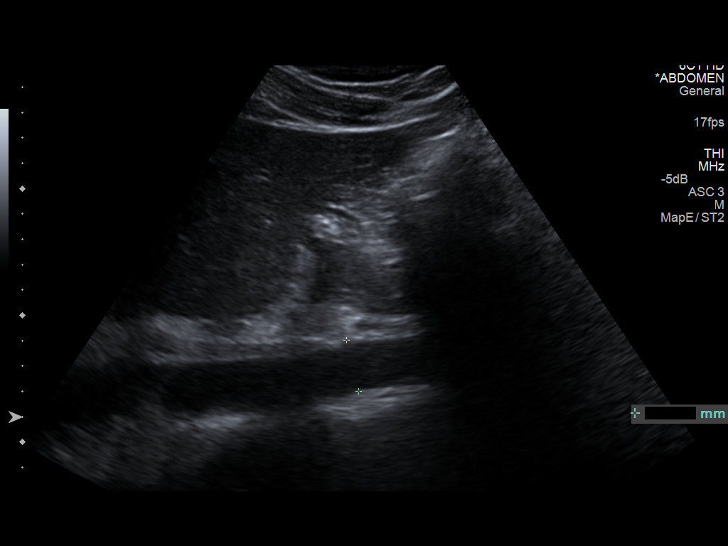
[im 14/82]
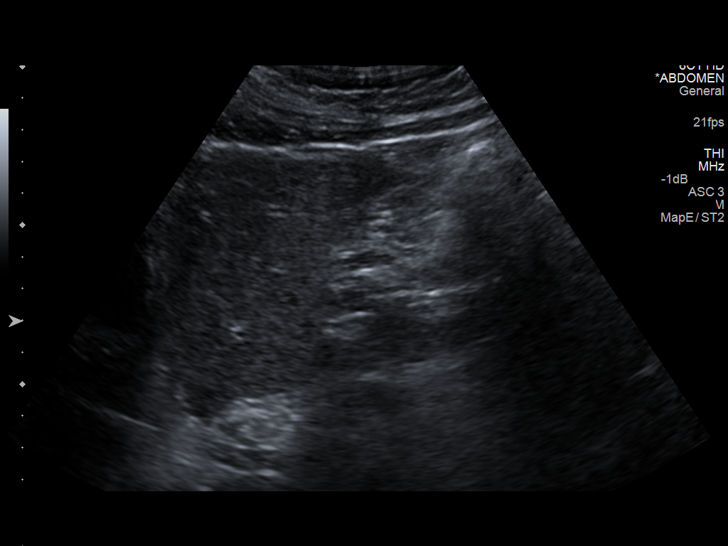
[im 21/82]
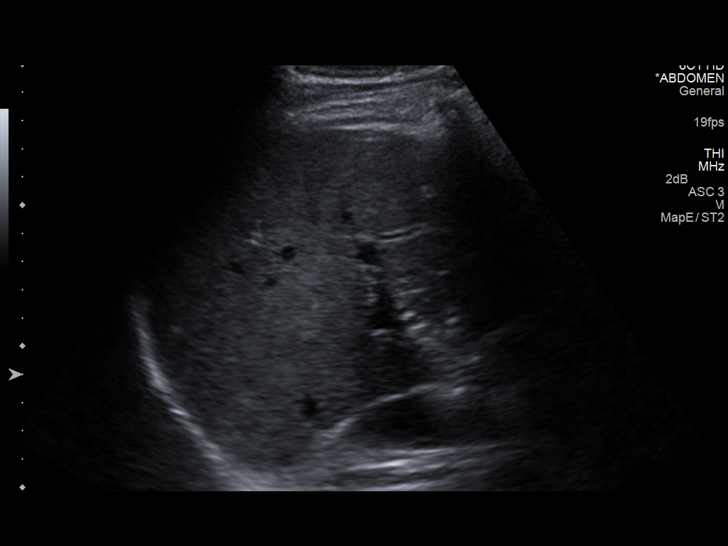
[im 28/82]
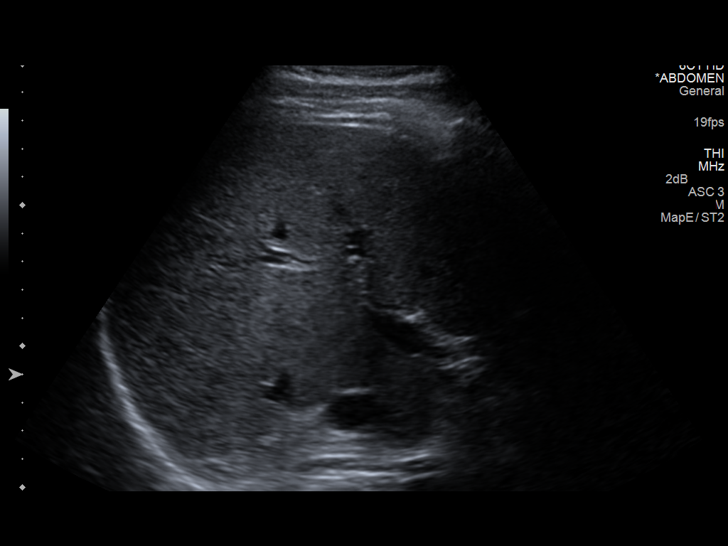
[im 31/82]
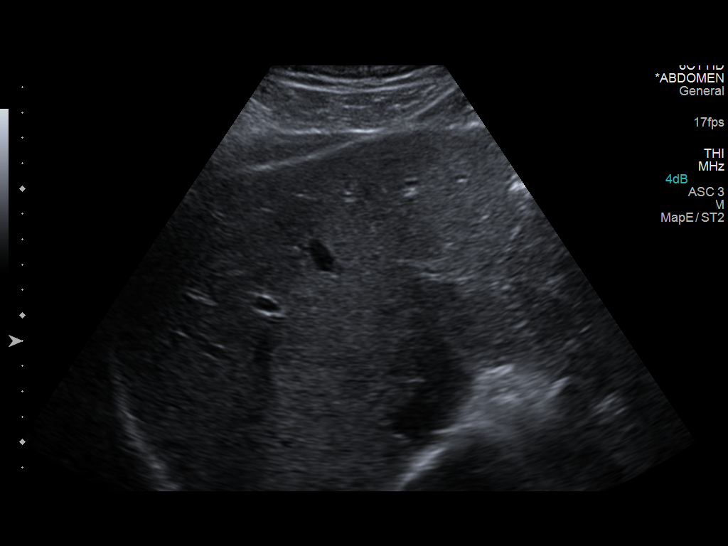
[im 38/82]
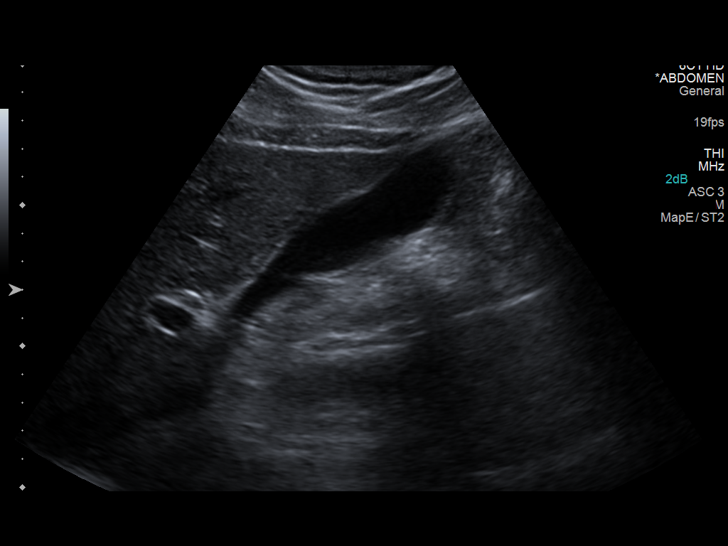
[im 44/82]
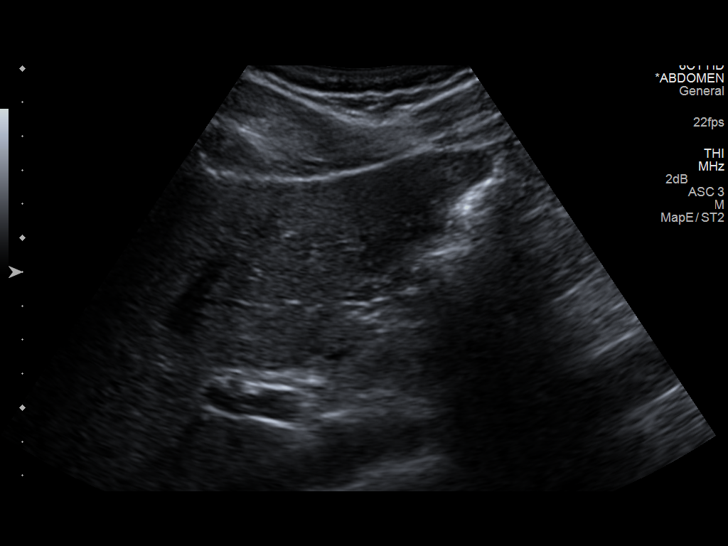
[im 51/82]
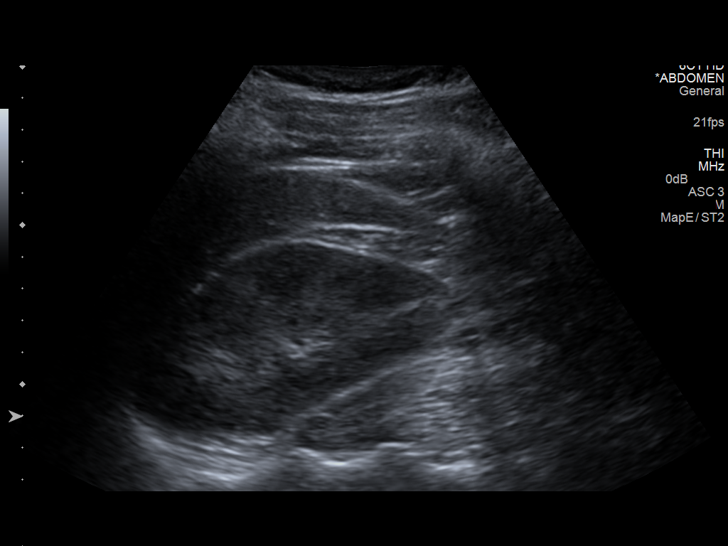
[im 55/82]
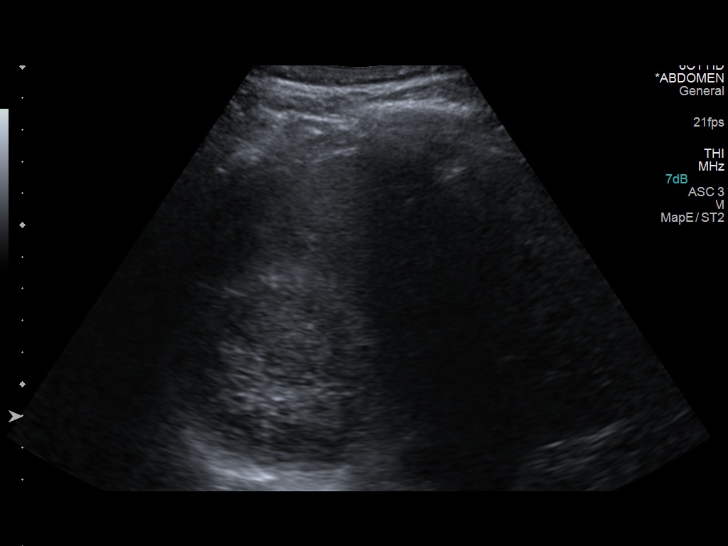
[im 61/82]
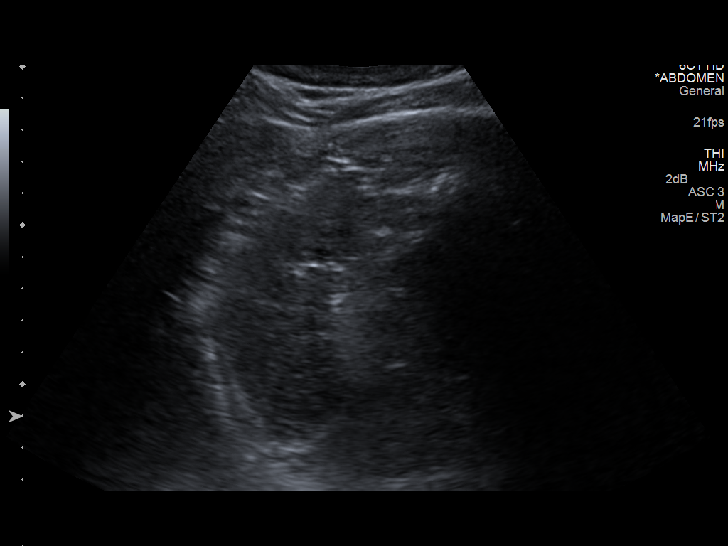
[im 68/82]
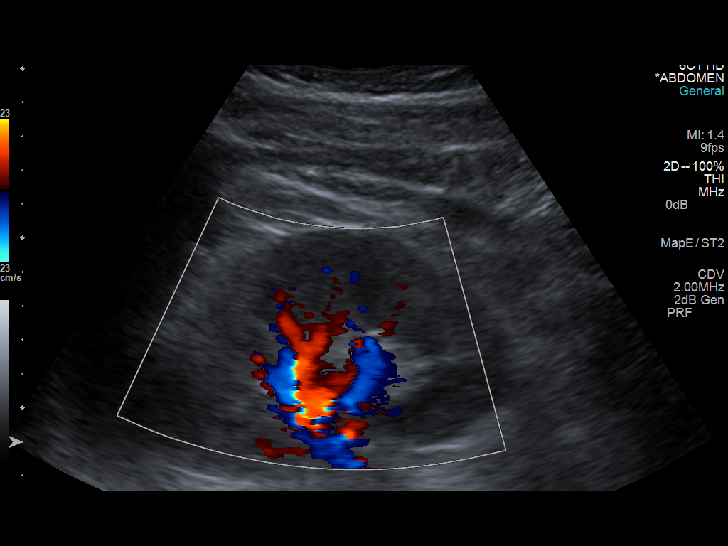
[im 75/82]
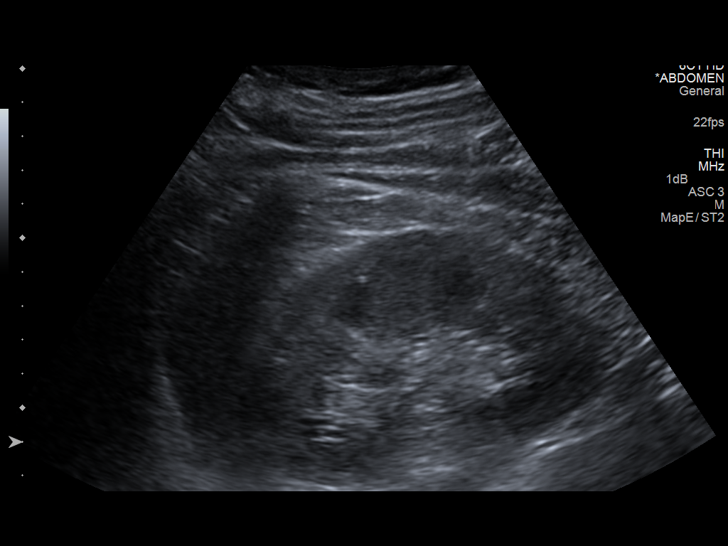
[im 82/82]
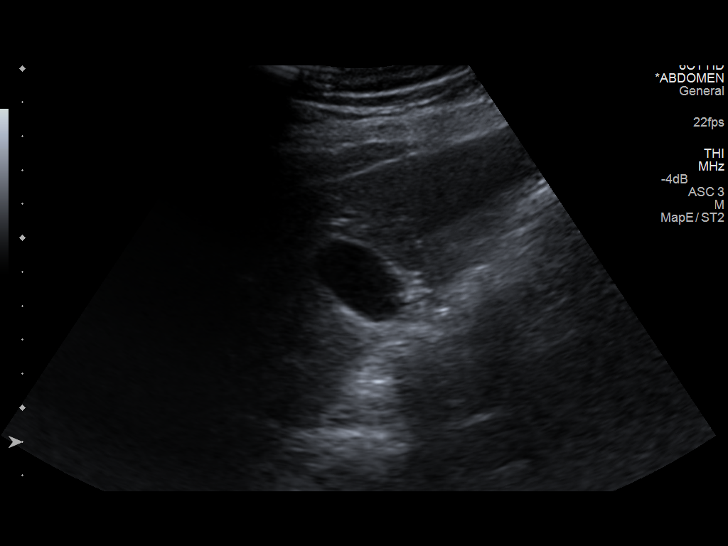

[14 of 25 positions shown; findings below may reference images not displayed]

FINDINGS: Gallbladder: No gallstones or wall thickening visualized. No
sonographic Murphy sign noted.

Common bile duct: Diameter: 3 mm.

Liver: No focal lesion identified. Within normal limits in
parenchymal echogenicity.

IVC: No abnormality visualized.

Pancreas: Visualized portion unremarkable.

Spleen: Size and appearance within normal limits.

Right Kidney: Length: 11.4 cm. Echogenicity within normal limits. No
mass or hydronephrosis visualized.

Left Kidney: Length: 11.4 cm. Echogenicity within normal limits. No
mass or hydronephrosis visualized.

Abdominal aorta: No aneurysm visualized.

Other findings: None.
IMPRESSION: Negative. No evidence of cholelithiasis or other significant
abnormality.

## 2017-04-10 ENCOUNTER — Other Ambulatory Visit: Payer: Self-pay | Admitting: Urgent Care

## 2017-04-10 ENCOUNTER — Ambulatory Visit (INDEPENDENT_AMBULATORY_CARE_PROVIDER_SITE_OTHER): Payer: BC Managed Care – PPO | Admitting: Urgent Care

## 2017-04-10 VITALS — BP 132/78 | HR 65 | Temp 97.9°F | Resp 16 | Ht 70.5 in | Wt 248.0 lb

## 2017-04-10 DIAGNOSIS — I10 Essential (primary) hypertension: Secondary | ICD-10-CM | POA: Diagnosis not present

## 2017-04-10 DIAGNOSIS — Z6835 Body mass index (BMI) 35.0-35.9, adult: Secondary | ICD-10-CM

## 2017-04-10 DIAGNOSIS — E669 Obesity, unspecified: Secondary | ICD-10-CM

## 2017-04-10 LAB — COMPREHENSIVE METABOLIC PANEL
ALK PHOS: 109 IU/L (ref 39–117)
ALT: 23 IU/L (ref 0–44)
AST: 23 IU/L (ref 0–40)
Albumin/Globulin Ratio: 1.6 (ref 1.2–2.2)
Albumin: 4.4 g/dL (ref 3.5–5.5)
BUN/Creatinine Ratio: 11 (ref 9–20)
BUN: 13 mg/dL (ref 6–24)
Bilirubin Total: 0.3 mg/dL (ref 0.0–1.2)
CALCIUM: 9.8 mg/dL (ref 8.7–10.2)
CO2: 26 mmol/L (ref 18–29)
Chloride: 102 mmol/L (ref 96–106)
Creatinine, Ser: 1.22 mg/dL (ref 0.76–1.27)
GFR, EST AFRICAN AMERICAN: 81 mL/min/{1.73_m2} (ref >59)
GFR, EST NON AFRICAN AMERICAN: 70 mL/min/{1.73_m2} (ref >59)
Globulin, Total: 2.8 g/dL (ref 1.5–4.5)
Glucose: 85 mg/dL (ref 65–99)
Potassium: 4.4 mmol/L (ref 3.5–5.2)
SODIUM: 143 mmol/L (ref 134–144)
TOTAL PROTEIN: 7.2 g/dL (ref 6.0–8.5)

## 2017-04-10 MED ORDER — LISINOPRIL-HYDROCHLOROTHIAZIDE 20-12.5 MG PO TABS: 1.0000 | ORAL_TABLET | Freq: Every day | ORAL | 3 refills | Status: DC

## 2017-04-10 NOTE — Progress Notes (Signed)
   MRN: [id number] DOB: [birth year]  Subjective:   Hunter Adams is a 47 y.o. male presenting for follow up on Hypertension.   HTN - Currently managed with lisinopril-HCTZ. Patient is checking blood pressure at home. Avoids salt in diet, is not exercising. Denies lightheadedness, dizziness, chronic headache, blurred vision, chest pain, shortness of breath, heart racing, palpitations, nausea, vomiting, abdominal pain, hematuria, lower leg swelling. Denies smoking cigarettes.   Obesity - Does not actively eat healthily. 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 Now, he plans on being more active, will look into purchasing a bicycle and exercising more regularly in the gym.  Hunter Adams has a current medication list which includes the following prescription(s): lisinopril-hydrochlorothiazide, cyclobenzaprine, and multivitamin with minerals. Also has No Known Allergies. Hunter Adams  has a past medical history of Allergy and Hypertension. Also denies past surgical history.   Objective:   Vitals: BP 132/78 (BP Location: Left Arm, Patient Position: Sitting, Cuff Size: Large)   Pulse 65   Temp 97.9 F (36.6 C) (Oral)   Resp 16   Ht 5' 10.5" (1.791 m)   Wt 248 lb (112.5 kg)   SpO2 97%   BMI 35.08 kg/m   Wt Readings from Last 3 Encounters:  04/10/17 248 lb (112.5 kg)  02/28/16 240 lb (108.9 kg)  01/07/15 240 lb 3.2 oz (109 kg)   BP Readings from Last 3 Encounters:  04/10/17 132/78  02/28/16 133/82  07/05/15 113/75   Physical Exam  Constitutional: He is oriented to person, place, and time. He appears well-developed and well-nourished.  HENT:  Mouth/Throat: Oropharynx is clear and moist.  Eyes: No scleral icterus.  Cardiovascular: Normal rate, regular rhythm and intact distal pulses.  Exam reveals no gallop and no friction rub.   No murmur heard. Pulmonary/Chest: No respiratory distress. He has no wheezes. He has no rales.  Musculoskeletal: He  exhibits no edema.  Neurological: He is alert and oriented to person, place, and time.  Skin: Skin is warm and dry.  Psychiatric: He has a normal mood and affect.   Assessment and Plan :   1. Essential hypertension - Labs pending, refilled lis-HCTZ. Recheck in 1 year. - Microalbumin/Creatinine Ratio, Urine - Comprehensive metabolic panel - lisinopril-hydrochlorothiazide (PRINZIDE,ZESTORETIC) 20-12.5 MG tablet; Take 1 tablet by mouth daily.  Dispense: 90 tablet; Refill: 3  2. Class 2 obesity without serious comorbidity with body mass index (BMI) of 35.0 to 35.9 in adult, unspecified obesity type - Encouraged weight loss through healthy diet and exercise.  Wallis Bamberg, PA-C [facility] [phone] 04/10/2017  8:14 AM

## 2017-04-10 NOTE — Patient Instructions (Addendum)
Hypertension Hypertension, commonly called high blood pressure, is when the force of blood pumping through the arteries is too strong. The arteries are the blood vessels that carry blood from the heart throughout the body. Hypertension forces the heart to work harder to pump blood and may cause arteries to become narrow or stiff. Having untreated or uncontrolled hypertension can cause heart attacks, strokes, kidney disease, and other problems. A blood pressure reading consists of a higher number over a lower number. Ideally, your blood pressure should be below 120/80. The first ("top") number is called the systolic pressure. It is a measure of the pressure in your arteries as your heart beats. The second ("bottom") number is called the diastolic pressure. It is a measure of the pressure in your arteries as the heart relaxes. What are the causes? The cause of this condition is not known. What increases the risk? Some risk factors for high blood pressure are under your control. Others are not. Factors you can change   Smoking.  Having type 2 diabetes mellitus, high cholesterol, or both.  Not getting enough exercise or physical activity.  Being overweight.  Having too much fat, sugar, calories, or salt (sodium) in your diet.  Drinking too much alcohol. Factors that are difficult or impossible to change   Having chronic kidney disease.  Having a family history of high blood pressure.  Age. Risk increases with age.  Race. You may be at higher risk if you are African-American.  Gender. Men are at higher risk than women before age 45. After age 65, women are at higher risk than men.  Having obstructive sleep apnea.  Stress. What are the signs or symptoms? Extremely high blood pressure (hypertensive crisis) may cause:  Headache.  Anxiety.  Shortness of breath.  Nosebleed.  Nausea and vomiting.  Severe chest pain.  Jerky movements you cannot control (seizures). How is this  diagnosed? This condition is diagnosed by measuring your blood pressure while you are seated, with your arm resting on a surface. The cuff of the blood pressure monitor will be placed directly against the skin of your upper arm at the level of your heart. It should be measured at least twice using the same arm. Certain conditions can cause a difference in blood pressure between your right and left arms. Certain factors can cause blood pressure readings to be lower or higher than normal (elevated) for a short period of time:  When your blood pressure is higher when you are in a health care provider's office than when you are at home, this is called white coat hypertension. Most people with this condition do not need medicines.  When your blood pressure is higher at home than when you are in a health care provider's office, this is called masked hypertension. Most people with this condition may need medicines to control blood pressure. If you have a high blood pressure reading during one visit or you have normal blood pressure with other risk factors:  You may be asked to return on a different day to have your blood pressure checked again.  You may be asked to monitor your blood pressure at home for 1 week or longer. If you are diagnosed with hypertension, you may have other blood or imaging tests to help your health care provider understand your overall risk for other conditions. How is this treated? This condition is treated by making healthy lifestyle changes, such as eating healthy foods, exercising more, and reducing your alcohol intake. Your health   care provider may prescribe medicine if lifestyle changes are not enough to get your blood pressure under control, and if:  Your systolic blood pressure is above 130.  Your diastolic blood pressure is above 80. Your personal target blood pressure may vary depending on your medical conditions, your age, and other factors. Follow these instructions  at home: Eating and drinking   Eat a diet that is high in fiber and potassium, and low in sodium, added sugar, and fat. An example eating plan is called the DASH (Dietary Approaches to Stop Hypertension) diet. To eat this way:  Eat plenty of fresh fruits and vegetables. Try to fill half of your plate at each meal with fruits and vegetables.  Eat whole grains, such as whole wheat pasta, brown rice, or whole grain bread. Fill about one quarter of your plate with whole grains.  Eat or drink low-fat dairy products, such as skim milk or low-fat yogurt.  Avoid fatty cuts of meat, processed or cured meats, and poultry with skin. Fill about one quarter of your plate with lean proteins, such as fish, chicken without skin, beans, eggs, and tofu.  Avoid premade and processed foods. These tend to be higher in sodium, added sugar, and fat.  Reduce your daily sodium intake. Most people with hypertension should eat less than 1,500 mg of sodium a day.  Limit alcohol intake to no more than 1 drink a day for nonpregnant women and 2 drinks a day for men. One drink equals 12 oz of beer, 5 oz of wine, or 1 oz of hard liquor. Lifestyle   Work with your health care provider to maintain a healthy body weight or to lose weight. Ask what an ideal weight is for you.  Get at least 30 minutes of exercise that causes your heart to beat faster (aerobic exercise) most days of the week. Activities may include walking, swimming, or biking.  Include exercise to strengthen your muscles (resistance exercise), such as pilates or lifting weights, as part of your weekly exercise routine. Try to do these types of exercises for 30 minutes at least 3 days a week.  Do not use any products that contain nicotine or tobacco, such as cigarettes and e-cigarettes. If you need help quitting, ask your health care provider.  Monitor your blood pressure at home as told by your health care provider.  Keep all follow-up visits as told by  your health care provider. This is important. Medicines   Take over-the-counter and prescription medicines only as told by your health care provider. Follow directions carefully. Blood pressure medicines must be taken as prescribed.  Do not skip doses of blood pressure medicine. Doing this puts you at risk for problems and can make the medicine less effective.  Ask your health care provider about side effects or reactions to medicines that you should watch for. Contact a health care provider if:  You think you are having a reaction to a medicine you are taking.  You have headaches that keep coming back (recurring).  You feel dizzy.  You have swelling in your ankles.  You have trouble with your vision. Get help right away if:  You develop a severe headache or confusion.  You have unusual weakness or numbness.  You feel faint.  You have severe pain in your chest or abdomen.  You vomit repeatedly.  You have trouble breathing. Summary  Hypertension is when the force of blood pumping through your arteries is too strong. If this condition is   not controlled, it may put you at risk for serious complications.  Your personal target blood pressure may vary depending on your medical conditions, your age, and other factors. For most people, a normal blood pressure is less than 120/80.  Hypertension is treated with lifestyle changes, medicines, or a combination of both. Lifestyle changes include weight loss, eating a healthy, low-sodium diet, exercising more, and limiting alcohol. This information is not intended to replace advice given to you by your health care provider. Make sure you discuss any questions you have with your health care provider. Document Released: 12/17/2005 Document Revised: 11/14/2016 Document Reviewed: 11/14/2016 Elsevier Interactive Patient Education  2017 [employer]     IF you received an x-ray today, you will receive an invoice from [facility].  Please contact [facility] at [phone] with questions or concerns regarding your invoice.   IF you received labwork today, you will receive an invoice from [facility]. Please contact [facility] at [phone] with questions or concerns regarding your invoice.   Our billing staff will not be able to assist you with questions regarding bills from these companies.  You will be contacted with the lab results as soon as they are available. The fastest way to get your results is to activate your My Chart account. Instructions are located on the last page of this paperwork. If you have not heard from us regarding the results in 2 weeks, please contact this office.      

## 2017-04-11 LAB — MICROALBUMIN / CREATININE URINE RATIO
Creatinine, Urine: 153.2 mg/dL
MICROALB/CREAT RATIO: 28.3 mg/g{creat} (ref 0.0–30.0)
Microalbumin, Urine: 43.4 ug/mL

## 2017-07-23 ENCOUNTER — Other Ambulatory Visit: Payer: Self-pay | Admitting: Urgent Care

## 2017-07-23 DIAGNOSIS — I10 Essential (primary) hypertension: Secondary | ICD-10-CM

## 2018-02-19 ENCOUNTER — Encounter: Payer: BC Managed Care – PPO | Admitting: Physician Assistant

## 2018-02-19 NOTE — Progress Notes (Signed)
Hunter Adams  MRN: [id number] DOB: [birth year]  Subjective:  Pt is a 48 y.o. male who presents for annual physical exam and medication refill. Pt is fasting today.   Diet:  Eatings lots of greens and salads. Eating [place], fish, and pork. Likes bread a lot. Gets a fair amount of dairy. Trying to stay away from fried food. Drinks sodas, juices, and water. No multivitamin. Has lost 6 lbs from last year by changing diet.  Exercise: Lifts weights 3-4 days a week. 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.  Sleep: 6 hours a night.  BM: Once a day, normal.  Urinary issue: Pt denies urinary frequency, nocturia, difficulty urinating, weak urinary stream, dribbling. Has been having some ED since starting bp medication about 5 years ago. Does not have many morning erections. Notes he can get an erection 50% of the time when aroused. Can maintain it the whole time during sexual intercourse if he gets one. No issues with ejaculation. Denies chest pain, SOB, nausea, and vomiting. Denies smoking. No FH of heart disease.   Last dental exam: 12/2017 Last vision exam: A little over a year, wears Rx contacts.   Vaccinations      Tetanus: 05/2014      Influenza: 10/2017  Chronic medical conditions: 1)HTN: Currently managed with lisinopril-HCTZ 20-12.16m. He has been out of mediation for a few days. He is checking blood pressure at home. Range is 1284Xsystolically.  Denies lightheadedness, dizziness, chronic headache, blurred vision, chest pain, shortness of breath, heart racing, palpitations, nausea, vomiting, abdominal pain, hematuria, lower leg swelling.   Acute issues. 1) Spot on back x 1 year.  Denies pain, redness, warmth, and purulent drainage.  Patient Active Problem List   Diagnosis Date Noted  . HTN (hypertension) 06/17/2014    Current Outpatient Medications on File Prior to Visit  Medication Sig Dispense Refill  . lisinopril-hydrochlorothiazide (PRINZIDE,ZESTORETIC) 20-12.5 MG tablet Take 1  tablet by mouth daily. 90 tablet 3  . lisinopril-hydrochlorothiazide (PRINZIDE,ZESTORETIC) 20-12.5 MG tablet TAKE ONE TABLET BY MOUTH ONCE DAILY 90 tablet 0  . Multiple Vitamin (MULTIVITAMIN WITH MINERALS) TABS tablet Take 1 tablet by mouth daily.    . cyclobenzaprine (FLEXERIL) 5 MG tablet Take 1-2 tablets (5-10 mg total) by mouth at bedtime. (Patient not taking: Reported on 04/10/2017) 60 tablet 1   No current facility-administered medications on file prior to visit.     No Known Allergies  Social History   Socioeconomic History  . Marital status: Single    Spouse name: None  . Number of children: 3  . Years of education: None  . Highest education level: None  Social Needs  . Financial resource strain: None  . Food insecurity - worry: None  . Food insecurity - inability: None  . Transportation needs - medical: None  . Transportation needs - non-medical: None  Occupational History  . None  Tobacco Use  . Smoking status: Never Smoker  . Smokeless tobacco: Never Used  Substance and Sexual Activity  . Alcohol use: Yes    Alcohol/week: 0.0 oz    Comment: occasionally   . Drug use: No  . Sexual activity: Yes    Partners: Female    Comment: with multiple partners, does not use condoms always  Other Topics Concern  . None  Social History Narrative   [place] Has three children. Lives at home alone. Works at [employer]  History reviewed. No pertinent surgical history.  Family History  Problem Relation Age of Onset  . Hypertension Mother   . Diabetes Mother   . Hypertension Father   . Heart disease Father   . Hypertension Maternal Grandmother   . Hypertension Maternal Grandfather   . Hypertension Paternal Grandmother   . Hypertension Paternal Grandfather     Review of Systems  Constitutional: Negative for activity change, appetite change, chills, diaphoresis, fatigue, fever and  unexpected weight change.  HENT: Negative for congestion, dental problem, drooling, ear discharge, ear pain, facial swelling, hearing loss, mouth sores, nosebleeds, postnasal drip, rhinorrhea, sinus pressure, sinus pain, sneezing, sore throat, tinnitus, trouble swallowing and voice change.   Eyes: Negative for photophobia, pain, discharge, redness, itching and visual disturbance.  Respiratory: Negative for apnea, cough, choking, chest tightness, shortness of breath, wheezing and stridor.   Cardiovascular: Negative for chest pain, palpitations and leg swelling.  Gastrointestinal: Negative for abdominal distention, abdominal pain, anal bleeding, blood in stool, constipation, diarrhea, nausea, rectal pain and vomiting.  Endocrine: Negative for cold intolerance, heat intolerance, polydipsia, polyphagia and polyuria.  Genitourinary: Negative for decreased urine volume, difficulty urinating, discharge, dysuria, enuresis, flank pain, frequency, genital sores, hematuria, penile pain, penile swelling, scrotal swelling, testicular pain and urgency.  Musculoskeletal: Negative for arthralgias, back pain, gait problem, joint swelling, myalgias, neck pain and neck stiffness.  Skin: Negative for color change, pallor, rash and wound.  Allergic/Immunologic: Negative for environmental allergies, food allergies and immunocompromised state.  Neurological: Negative for dizziness, tremors, seizures, syncope, facial asymmetry, speech difficulty, weakness, light-headedness, numbness and headaches.  Hematological: Negative for adenopathy. Does not bruise/bleed easily.  Psychiatric/Behavioral: Negative for agitation, behavioral problems, confusion, decreased concentration, dysphoric mood, hallucinations, self-injury, sleep disturbance and suicidal ideas. The patient is not nervous/anxious and is not hyperactive.     Objective:  BP 122/76 (BP Location: Left Arm, Patient Position: Sitting, Cuff Size: Large)   Pulse 73   Temp  98.3 F (36.8 C) (Oral)   Resp 18   Ht 5' 9.29" (1.76 m)   Wt 242 lb 9.6 oz (110 kg)   SpO2 98%   BMI 35.53 kg/m   Physical Exam  Constitutional: He is oriented to person, place, and time and well-developed, well-nourished, and in no distress.  HENT:  Head: Normocephalic and atraumatic.  Right Ear: Hearing, tympanic membrane, external ear and ear canal normal.  Left Ear: Hearing, tympanic membrane, external ear and ear canal normal.  Nose: Nose normal.  Mouth/Throat: Uvula is midline, oropharynx is clear and moist and mucous membranes are normal. No oropharyngeal exudate.  Eyes: Conjunctivae and EOM are normal. Pupils are equal, round, and reactive to light.  Neck: Trachea normal and normal range of motion.  Cardiovascular: Normal rate, regular rhythm, normal heart sounds and intact distal pulses.  Pulmonary/Chest: Effort normal and breath sounds normal.  Abdominal: Soft. Normal appearance and bowel sounds are normal.  Musculoskeletal: Normal range of motion.  Lymphadenopathy:       Head (right side): No submental, no submandibular, no tonsillar, no preauricular, no posterior auricular and no occipital adenopathy present.       Head (left side): No submental, no submandibular, no tonsillar, no preauricular, no posterior auricular and no occipital adenopathy present.    He has no cervical adenopathy.       Right: No supraclavicular adenopathy present.       Left: No supraclavicular adenopathy present.  Neurological: He is alert and oriented to person, place, and time. He has  normal sensation, normal strength and normal reflexes. Gait normal.  Skin: Skin is warm and dry.     Psychiatric: Affect normal.  Vitals reviewed.   Visual Acuity Screening   Right eye Left eye Both eyes  Without correction:     With correction: 20/25 20/50 20/25     Wt Readings from Last 3 Encounters:  02/20/18 242 lb 9.6 oz (110 kg)  04/10/17 248 lb (112.5 kg)  02/28/16 240 lb (108.9 kg)   PROCEDURE  NOTE: Removal of Skin Lesion Verbal consent obtained. Site cleansed with alcohol pad.  Iris scissors and forceps used to remove skin lesion. Skin lesion sent for dermatology pathology review. Silver nitrite stick applied to stop bleeding. Bandaid applied. After care instructions provided.   EKG shows sinus bradycardia with rate of 58. PR and QRS intervals within normal limits. No acute ST or T wave abnormalities. No prior EKG for comparison.    Assessment and Plan :  Discussed healthy lifestyle, diet, exercise, preventative care, vaccinations, and addressed patient's concerns. Plan for follow up in one month. Otherwise, plan for specific conditions below.  1. Annual physical exam Await lab results.   2. Essential hypertension Well-controlled at this time.  Patient has not been on medication for a couple days.  Due to difficulties with ED, recommend he continue only with lisinopril 20 mg at this time.  Continue HCTZ 12.5 mg.  Encouraged to monitor blood pressure over the next few weeks on lisinopril only.  If BP readings consistently > 140/90, add back HCTZ.  Return in 1 month for reevaluation.  If still having lisinopril only, consider Rx for Viagra. - CBC with Differential/Platelet - CMP14+EGFR - Urinalysis, dipstick only - EKG 12-Lead - lisinopril (PRINIVIL,ZESTRIL) 20 MG tablet; Take 1 tablet (20 mg total) by mouth daily.  Dispense: 90 tablet; Refill: 3 - hydrochlorothiazide (MICROZIDE) 12.5 MG capsule; Take 1 capsule (12.5 mg total) by mouth daily.  Dispense: 90 capsule; Refill: 3  3. Screening, lipid  - Lipid panel  4. Screening for thyroid disorder - TSH  5. Screen for STD (sexually transmitted disease) - GC/Chlamydia Probe Amp - Hepatitis panel, acute - HIV antibody - RPR - Trichomonas vaginalis, RNA  6. Has multiple sexual partners Did not have enough blood work for RPR or acute hepatitis. Pt declined getting additional lab work.  - GC/Chlamydia Probe Amp - Hepatitis  panel, acute - HIV antibody - RPR - Trichomonas vaginalis, RNA  7. Skin lesion Physical exam findings consistent with skin tag. - Dermatology pathology  8. Erectile dysfunction, unspecified erectile dysfunction type Hypertension is well controlled.  Additional labs pending.  Patient is eating healthy and exercising.  EKG normal.  Return in 1 month for reevaluation.  If no improvement with discontinuing HCTZ, recommend trial of Viagra at that time.  Tenna Delaine, PA-C  [facility] 02/20/2018 11:09 PM

## 2018-02-20 ENCOUNTER — Ambulatory Visit (INDEPENDENT_AMBULATORY_CARE_PROVIDER_SITE_OTHER): Payer: BC Managed Care – PPO | Admitting: Physician Assistant

## 2018-02-20 ENCOUNTER — Other Ambulatory Visit: Payer: Self-pay

## 2018-02-20 ENCOUNTER — Encounter: Payer: Self-pay | Admitting: Physician Assistant

## 2018-02-20 VITALS — BP 122/76 | HR 73 | Temp 98.3°F | Resp 18 | Ht 69.29 in | Wt 242.6 lb

## 2018-02-20 DIAGNOSIS — Z9189 Other specified personal risk factors, not elsewhere classified: Secondary | ICD-10-CM

## 2018-02-20 DIAGNOSIS — Z13220 Encounter for screening for lipoid disorders: Secondary | ICD-10-CM

## 2018-02-20 DIAGNOSIS — Z0000 Encounter for general adult medical examination without abnormal findings: Secondary | ICD-10-CM | POA: Diagnosis not present

## 2018-02-20 DIAGNOSIS — L989 Disorder of the skin and subcutaneous tissue, unspecified: Secondary | ICD-10-CM | POA: Diagnosis not present

## 2018-02-20 DIAGNOSIS — I10 Essential (primary) hypertension: Secondary | ICD-10-CM

## 2018-02-20 DIAGNOSIS — N529 Male erectile dysfunction, unspecified: Secondary | ICD-10-CM | POA: Diagnosis not present

## 2018-02-20 DIAGNOSIS — Z1329 Encounter for screening for other suspected endocrine disorder: Secondary | ICD-10-CM | POA: Diagnosis not present

## 2018-02-20 DIAGNOSIS — Z113 Encounter for screening for infections with a predominantly sexual mode of transmission: Secondary | ICD-10-CM | POA: Diagnosis not present

## 2018-02-20 MED ORDER — HYDROCHLOROTHIAZIDE 12.5 MG PO CAPS: 12.5000 mg | ORAL_CAPSULE | Freq: Every day | ORAL | 3 refills | Status: DC

## 2018-02-20 MED ORDER — LISINOPRIL 20 MG PO TABS: 20.0000 mg | ORAL_TABLET | Freq: Every day | ORAL | 3 refills | Status: DC

## 2018-02-20 NOTE — Patient Instructions (Addendum)
For your blood pressure, I have given you 2 separate tablets for lisinopril 20 mg and HCTZ 12.5 mg.  I recommend taking only lisinopril 20 mg for the next few weeks to see if this adequately controls your blood pressure and also improves sexual dysfunction.  Your goal blood pressure is less than 140/90.  If it remains over this with just this medication, please start HCTZ and contact our office.  Plan to follow-up in office in 1 month for reevaluation.  We should have your lab results back within 1 week and will contact you with those results.  Health Maintenance, Male A healthy lifestyle and preventive care is important for your health and wellness. Ask your health care provider about what schedule of regular examinations is right for you. What should I know about weight and diet? Eat a Healthy Diet  Eat plenty of vegetables, fruits, whole grains, low-fat dairy products, and lean protein.  Do not eat a lot of foods high in solid fats, added sugars, or salt.  Maintain a Healthy Weight Regular exercise can help you achieve or maintain a healthy weight. You should:  Do at least 150 minutes of exercise each week. The exercise should increase your heart rate and make you sweat (moderate-intensity exercise).  Do strength-training exercises at least twice a week.  Watch Your Levels of Cholesterol and Blood Lipids  Have your blood tested for lipids and cholesterol every 5 years starting at 50 years of age. If you are at high risk for heart disease, you should start having your blood tested when you are 50 years old. You may need to have your cholesterol levels checked more often if: ? Your lipid or cholesterol levels are high. ? You are older than 50 years of age. ? You are at high risk for heart disease.  What should I know about cancer screening? Many types of cancers can be detected early and may often be prevented. Lung Cancer  You should be screened every year for lung cancer if: ? You  are a current smoker who has smoked for at least 30 years. ? You are a former smoker who has quit within the past 15 years.  Talk to your health care provider about your screening options, when you should start screening, and how often you should be screened.  Colorectal Cancer  Routine colorectal cancer screening usually begins at 50 years of age and should be repeated every 5-10 years until you are 50 years old. You may need to be screened more often if early forms of precancerous polyps or small growths are found. Your health care provider may recommend screening at an earlier age if you have risk factors for colon cancer.  Your health care provider may recommend using home test kits to check for hidden blood in the stool.  A small camera at the end of a tube can be used to examine your colon (sigmoidoscopy or colonoscopy). This checks for the earliest forms of colorectal cancer.  Prostate and Testicular Cancer  Depending on your age and overall health, your health care provider may do certain tests to screen for prostate and testicular cancer.  Talk to your health care provider about any symptoms or concerns you have about testicular or prostate cancer.  Skin Cancer  Check your skin from head to toe regularly.  Tell your health care provider about any new moles or changes in moles, especially if: ? There is a change in a mole's size, shape, or color. ?  You have a mole that is larger than a pencil eraser.  Always use sunscreen. Apply sunscreen liberally and repeat throughout the day.  Protect yourself by wearing long sleeves, pants, a wide-brimmed hat, and sunglasses when outside.  What should I know about heart disease, diabetes, and high blood pressure?  If you are 48-9 years of age, have your blood pressure checked every 3-5 years. If you are 21 years of age or older, have your blood pressure checked every year. You should have your blood pressure measured twice-once when you  are at a hospital or clinic, and once when you are not at a hospital or clinic. Record the average of the two measurements. To check your blood pressure when you are not at a hospital or clinic, you can use: ? An automated blood pressure machine at a pharmacy. ? A home blood pressure monitor.  Talk to your health care provider about your target blood pressure.  If you are between 41-60 years old, ask your health care provider if you should take aspirin to prevent heart disease.  Have regular diabetes screenings by checking your fasting blood sugar level. ? If you are at a normal weight and have a low risk for diabetes, have this test once every three years after the age of 51. ? If you are overweight and have a high risk for diabetes, consider being tested at a younger age or more often.  A one-time screening for abdominal aortic aneurysm (AAA) by ultrasound is recommended for men aged 65-75 years who are current or former smokers. What should I know about preventing infection? Hepatitis B If you have a higher risk for hepatitis B, you should be screened for this virus. Talk with your health care provider to find out if you are at risk for hepatitis B infection. Hepatitis C Blood testing is recommended for:  Everyone born from [birth year] through 1967.  Anyone with known risk factors for hepatitis C.  Sexually Transmitted Diseases (STDs)  You should be screened each year for STDs including gonorrhea and chlamydia if: ? You are sexually active and are younger than 50 years of age. ? You are older than 50 years of age and your health care provider tells you that you are at risk for this type of infection. ? Your sexual activity has changed since you were last screened and you are at an increased risk for chlamydia or gonorrhea. Ask your health care provider if you are at risk.  Talk with your health care provider about whether you are at high risk of being infected with HIV. Your health care  provider may recommend a prescription medicine to help prevent HIV infection.  What else can I do?  Schedule regular health, dental, and eye exams.  Stay current with your vaccines (immunizations).  Do not use any tobacco products, such as cigarettes, chewing tobacco, and e-cigarettes. If you need help quitting, ask your health care provider.  Limit alcohol intake to no more than 2 drinks per day. One drink equals 12 ounces of beer, 5 ounces of wine, or 1 ounces of hard liquor.  Do not use street drugs.  Do not share needles.  Ask your health care provider for help if you need support or information about quitting drugs.  Tell your health care provider if you often feel depressed.  Tell your health care provider if you have ever been abused or do not feel safe at home. This information is not intended to replace advice given  to you by your health care provider. Make sure you discuss any questions you have with your health care provider. Document Released: 06/14/2008 Document Revised: 08/15/2016 Document Reviewed: 09/20/2015 Elsevier Interactive Patient Education  2018 [employer]   IF you received an x-ray today, you will receive an invoice from [facility]. Please contact [facility] at [phone] with questions or concerns regarding your invoice.   IF you received labwork today, you will receive an invoice from [facility]. Please contact [facility] at [phone] with questions or concerns regarding your invoice.   Our billing staff will not be able to assist you with questions regarding bills from these companies.  You will be contacted with the lab results as soon as they are available. The fastest way to get your results is to activate your My Chart account. Instructions are located on the last page of this paperwork. If you have not heard from us regarding the results in 2 weeks, please contact this office.

## 2018-02-21 LAB — GC/CHLAMYDIA PROBE AMP
Chlamydia trachomatis, NAA: NEGATIVE
NEISSERIA GONORRHOEAE BY PCR: NEGATIVE

## 2018-02-21 LAB — URINALYSIS, DIPSTICK ONLY
BILIRUBIN UA: NEGATIVE
Glucose, UA: NEGATIVE
KETONES UA: NEGATIVE
Leukocytes, UA: NEGATIVE
Nitrite, UA: NEGATIVE
PH UA: 5 (ref 5.0–7.5)
Protein, UA: NEGATIVE
RBC, UA: NEGATIVE
Specific Gravity, UA: 1.027 (ref 1.005–1.030)
Urobilinogen, Ur: 0.2 mg/dL (ref 0.2–1.0)

## 2018-02-21 LAB — CBC WITH DIFFERENTIAL/PLATELET
BASOS: 0 %
Basophils Absolute: 0 10*3/uL (ref 0.0–0.2)
EOS (ABSOLUTE): 0.1 10*3/uL (ref 0.0–0.4)
Eos: 2 %
Hematocrit: 39.5 % (ref 37.5–51.0)
Hemoglobin: 13.2 g/dL (ref 13.0–17.7)
Immature Grans (Abs): 0 10*3/uL (ref 0.0–0.1)
Immature Granulocytes: 0 %
Lymphocytes Absolute: 2.3 10*3/uL (ref 0.7–3.1)
Lymphs: 36 %
MCH: 28.4 pg (ref 26.6–33.0)
MCHC: 33.4 g/dL (ref 31.5–35.7)
MCV: 85 fL (ref 79–97)
MONOS ABS: 0.4 10*3/uL (ref 0.1–0.9)
Monocytes: 6 %
NEUTROS ABS: 3.7 10*3/uL (ref 1.4–7.0)
NEUTROS PCT: 56 %
Platelets: 206 10*3/uL (ref 150–379)
RBC: 4.64 x10E6/uL (ref 4.14–5.80)
RDW: 14.8 % (ref 12.3–15.4)
WBC: 6.5 10*3/uL (ref 3.4–10.8)

## 2018-02-21 LAB — LIPID PANEL
CHOLESTEROL TOTAL: 182 mg/dL (ref 100–199)
Chol/HDL Ratio: 5.7 ratio — ABNORMAL HIGH (ref 0.0–5.0)
HDL: 32 mg/dL — ABNORMAL LOW (ref >39)
LDL Calculated: 130 mg/dL — ABNORMAL HIGH (ref 0–99)
Triglycerides: 102 mg/dL (ref 0–149)
VLDL Cholesterol Cal: 20 mg/dL (ref 5–40)

## 2018-02-21 LAB — CMP14+EGFR
ALT: 22 IU/L (ref 0–44)
AST: 20 IU/L (ref 0–40)
Albumin/Globulin Ratio: 1.5 (ref 1.2–2.2)
Albumin: 4.5 g/dL (ref 3.5–5.5)
Alkaline Phosphatase: 107 IU/L (ref 39–117)
BUN/Creatinine Ratio: 10 (ref 9–20)
BUN: 13 mg/dL (ref 6–24)
Bilirubin Total: 0.4 mg/dL (ref 0.0–1.2)
CO2: 24 mmol/L (ref 20–29)
Calcium: 9.5 mg/dL (ref 8.7–10.2)
Chloride: 102 mmol/L (ref 96–106)
Creatinine, Ser: 1.24 mg/dL (ref 0.76–1.27)
GFR calc Af Amer: 78 mL/min/{1.73_m2} (ref >59)
GFR calc non Af Amer: 68 mL/min/{1.73_m2} (ref >59)
GLUCOSE: 108 mg/dL — AB (ref 65–99)
Globulin, Total: 3.1 g/dL (ref 1.5–4.5)
POTASSIUM: 4.5 mmol/L (ref 3.5–5.2)
Sodium: 142 mmol/L (ref 134–144)
Total Protein: 7.6 g/dL (ref 6.0–8.5)

## 2018-02-21 LAB — TSH: TSH: 0.902 u[IU]/mL (ref 0.450–4.500)

## 2018-02-21 LAB — TRICHOMONAS VAGINALIS, PROBE AMP: TRICH VAG BY NAA: NEGATIVE

## 2018-02-21 LAB — HIV ANTIBODY (ROUTINE TESTING W REFLEX): HIV SCREEN 4TH GENERATION: NONREACTIVE

## 2018-03-03 ENCOUNTER — Other Ambulatory Visit: Payer: Self-pay | Admitting: Physician Assistant

## 2018-03-03 MED ORDER — ATORVASTATIN CALCIUM 20 MG PO TABS: 20.0000 mg | ORAL_TABLET | Freq: Every day | ORAL | 3 refills | Status: DC

## 2018-03-03 NOTE — Progress Notes (Signed)
Meds ordered this encounter  Medications  . atorvastatin (LIPITOR) 20 MG tablet    Sig: Take 1 tablet (20 mg total) by mouth daily.    Dispense:  90 tablet    Refill:  3    Order Specific Question:   Supervising Provider    Answer:   SMITH, KRISTI M [[id number]]    

## 2018-03-24 ENCOUNTER — Other Ambulatory Visit: Payer: Self-pay

## 2018-03-24 ENCOUNTER — Ambulatory Visit: Payer: BC Managed Care – PPO | Admitting: Physician Assistant

## 2018-03-24 ENCOUNTER — Encounter: Payer: Self-pay | Admitting: Physician Assistant

## 2018-03-24 VITALS — BP 133/80 | HR 70 | Temp 98.7°F | Resp 18 | Ht 69.29 in | Wt 237.8 lb

## 2018-03-24 DIAGNOSIS — R739 Hyperglycemia, unspecified: Secondary | ICD-10-CM

## 2018-03-24 DIAGNOSIS — I10 Essential (primary) hypertension: Secondary | ICD-10-CM

## 2018-03-24 DIAGNOSIS — N529 Male erectile dysfunction, unspecified: Secondary | ICD-10-CM

## 2018-03-24 MED ORDER — AMLODIPINE BESYLATE 5 MG PO TABS: 5.0000 mg | ORAL_TABLET | Freq: Every day | ORAL | 0 refills | Status: DC

## 2018-03-24 NOTE — Progress Notes (Signed)
hct    MRN: [id number] DOB: 05-Aug-1970  Subjective:   Hunter Adams is a 48 y.o. male presenting for follow up on Hypertension.  Patient last seen on 02/20/18 for complete physical exam.  During that visit, we had discussed that patient was experiencing some ED after starting hydrochlorthiazide 5 years ago.  We obtained an EKG at last visit and it was normal.  We discussed patient discontinuing HCTZ and continuing only with lisinopril for blood pressure control.  Patient notes he did this for a couple weeks but got nervous and started taking both medications again.  He was not taking his blood pressure while off HCTZ.  He did not notice any difference in the ED over the 2 weeks that he was off the medication. Denies lightheadedness, dizziness, chronic headache, double vision, chest pain, shortness of breath, heart racing, palpitations, nausea, vomiting, abdominal pain, hematuria, lower leg swelling. Patient has lost weight since last visit.  He has changed his diet and is focusing more greens.  He has cut down on fast food and fried foods.He wants to start exercising more.  Denies smoking.Fasting blood sugar at last visit was 108.  He has never had an A1c checked.  Has family history of diabetes in mother. Does not want additional blood work today.    Review of Systems  Gastrointestinal: Negative for abdominal pain.  Genitourinary: Negative for frequency and urgency.  Neurological: Negative for dizziness.  Endo/Heme/Allergies: Negative for polydipsia.    Hunter Adams has a current medication list which includes the following prescription(s): atorvastatin, hydrochlorothiazide, lisinopril, multivitamin with minerals, and cyclobenzaprine. Also has No Known Allergies.  Hunter Adams  has a past medical history of Allergy and Hypertension. Also  has no past surgical history on file.    Social History   Socioeconomic History  . Marital status: Single    Spouse name: Not on file  . Number of  children: 3  . Years of education: Not on file  . Highest education level: Not on file  Occupational History  . Not on file  Social Needs  . Financial resource strain: Not on file  . Food insecurity:    Worry: Not on file    Inability: Not on file  . Transportation needs:    Medical: Not on file    Non-medical: Not on file  Tobacco Use  . Smoking status: Never Smoker  . Smokeless tobacco: Never Used  Substance and Sexual Activity  . Alcohol use: Yes    Alcohol/week: 0.0 oz    Comment: occasionally   . Drug use: No  . Sexual activity: Yes    Partners: Female    Comment: with multiple partners, does not use condoms always  Lifestyle  . Physical activity:    Days per week: 3 days    Minutes per session: 30 min  . Stress: Not at all  Relationships  . Social connections:    Talks on phone: Twice a week    Gets together: Once a week    Attends religious service: More than 4 times per year    Active member of club or organization: Yes    Attends meetings of clubs or organizations: More than 4 times per year    Relationship status: Never married  . Intimate partner violence:    Fear of current or ex partner: No    Emotionally abused: No    Physically abused: No    Forced sexual activity: No  Other Topics Concern  . Not on  file  Social History Narrative   Pt is from [place], [place]. Has lived in [place] since 1998. Has three children. Lives at home alone. Works at [employer] at [employer].     Objective:   Vitals: BP 133/80 (BP Location: Left Arm, Patient Position: Sitting, Cuff Size: Large)   Pulse 70   Temp 98.7 F (37.1 C) (Oral)   Resp 18   Ht 5' 9.29" (1.76 m)   Wt 237 lb 12.8 oz (107.9 kg)   SpO2 97%   BMI 34.82 kg/m   Physical Exam  Constitutional: He is oriented to person, place, and time. He appears well-developed and well-nourished.  HENT:  Head: Normocephalic and atraumatic.  Eyes: Conjunctivae are normal.  Neck: Normal range of  motion.  Cardiovascular: Normal rate, regular rhythm, normal heart sounds and intact distal pulses.  Pulmonary/Chest: Effort normal and breath sounds normal.  Musculoskeletal:       Right lower leg: Normal. He exhibits no swelling.       Left lower leg: Normal. He exhibits no swelling.  Neurological: He is alert and oriented to person, place, and time.  Skin: Skin is warm and dry.  Psychiatric: He has a normal mood and affect.  Vitals reviewed.   No results found for this or any previous visit (from the past 24 hour(s)).  Wt Readings from Last 3 Encounters:  03/24/18 237 lb 12.8 oz (107.9 kg)  02/20/18 242 lb 9.6 oz (110 kg)  04/10/17 248 lb (112.5 kg)    Assessment and Plan :  1. Essential hypertension Controlled at this time.  Recommend discontinuing HCTZ at this time.  Continue lisinopril and start amlodipine daily. BP goal is <140/90 and >100/60. D/c amlodipine if drops below lower value. Return to office if  Consistently >140/90. If normal range, return in 3 months.  - amLODipine (NORVASC) 5 MG tablet; Take 1 tablet (5 mg total) by mouth daily.  Dispense: 90 tablet; Refill: 0  2. Hyperglycemia Patient declines A1c.  Would like to continue working on diet and follow-up in 3 months for reevaluation.  Will check fasting glucose at that time.  If elevated, will add on A1c.  3. Erectile dysfunction, unspecified erectile dysfunction type Patient declines additional medication such as Viagra at this time.  He would like to change blood pressure medications to see if this helps.  If no improvement with changing medication and blood pressure remains controlled, recommend checking A1c to ensure he does not have prediabetes or diabetes and then, depending on results, can consider adding on medication such as Viagra.  Patient understands and agrees to plan.  Benjiman Core, PA-C  Primary [facility] at [facility] 03/24/2018 8:26 AM

## 2018-03-24 NOTE — Patient Instructions (Addendum)
For blood pressure control, discontinue hydrochlorthiazide.  Continue lisinopril.  Start amlodipine along with lisinopril. I recommend checking your blood pressure outside the office.  Your goal is < 140/90 and >100/60.  If your blood pressure ever drops below the lower value, discontinue amlodipine and continue only with lisinopril.  If your blood pressure is continuously greater than 140/90 on new medications, please make an appointment to follow-up with me.  otherwise, follow-up in 3 months if your blood pressure remains normal.  In terms of your elevated sugar, I recommend reading the information below about preventing type 2 diabetes.  We will recheck your sugar at your follow-up visit.  If it is still elevated will add on A1c.  Congratulations on the weight loss.  Continue with a hard work.   Preventing Type 2 Diabetes Mellitus Type 2 diabetes (type 2 diabetes mellitus) is a long-term (chronic) disease that affects blood sugar (glucose) levels. Normally, a hormone called insulin allows glucose to enter cells in the body. The cells use glucose for energy. In type 2 diabetes, one or both of these problems may be present:  The body does not make enough insulin.  The body does not respond properly to insulin that it makes (insulin resistance).  Insulin resistance or lack of insulin causes excess glucose to build up in the blood instead of going into cells. As a result, high blood glucose (hyperglycemia) develops, which can cause many complications. Being overweight or obese and having an inactive (sedentary) lifestyle can increase your risk for diabetes. Type 2 diabetes can be delayed or prevented by making certain nutrition and lifestyle changes. What nutrition changes can be made?  Eat healthy meals and snacks regularly. Keep a healthy snack with you for when you get hungry between meals, such as fruit or a handful of nuts.  Eat lean meats and proteins that are low in saturated fats, such as  chicken, fish, egg whites, and beans. Avoid processed meats.  Eat plenty of fruits and vegetables and plenty of grains that have not been processed (whole grains). It is recommended that you eat: ? 1?2 cups of fruit every day. ? 2?3 cups of vegetables every day. ? 6?8 oz of whole grains every day, such as oats, whole wheat, bulgur, brown rice, quinoa, and millet.  Eat low-fat dairy products, such as milk, yogurt, and cheese.  Eat foods that contain healthy fats, such as nuts, avocado, olive oil, and canola oil.  Drink water throughout the day. Avoid drinks that contain added sugar, such as soda or sweet tea.  Follow instructions from your health care provider about specific eating or drinking restrictions.  Control how much food you eat at a time (portion size). ? Check food labels to find out the serving sizes of foods. ? Use a kitchen scale to weigh amounts of foods.  Saute or steam food instead of frying it. Cook with water or broth instead of oils or butter.  Limit your intake of: ? Salt (sodium). Have no more than 1 tsp (2,400 mg) of sodium a day. If you have heart disease or high blood pressure, have less than ? tsp (1,500 mg) of sodium a day. ? Saturated fat. This is fat that is solid at room temperature, such as butter or fat on meat. What lifestyle changes can be made?  Activity  Do moderate-intensity physical activity for at least 30 minutes on at least 5 days of the week, or as much as told by your health care provider.  Ask your health care provider what activities are safe for you. A mix of physical activities may be best, such as walking, swimming, cycling, and strength training.  Try to add physical activity into your day. For example: ? Park in spots that are farther away than usual, so that you walk more. For example, park in a far corner of the parking lot when you go to the office or the grocery store. ? Take a walk during your lunch break. ? Use stairs  instead of elevators or escalators. Weight Loss  Lose weight as directed. Your health care provider can determine how much weight loss is best for you and can help you lose weight safely.  If you are overweight or obese, you may be instructed to lose at least 5?7 % of your body weight. Alcohol and Tobacco   Limit alcohol intake to no more than 1 drink a day for nonpregnant women and 2 drinks a day for men. One drink equals 12 oz of beer, 5 oz of wine, or 1 oz of hard liquor.  Do not use any tobacco products, such as cigarettes, chewing tobacco, and e-cigarettes. If you need help quitting, ask your health care provider. Work With Your Health Care Provider  Have your blood glucose tested regularly, as told by your health care provider.  Discuss your risk factors and how you can reduce your risk for diabetes.  Get screening tests as told by your health care provider. You may have screening tests regularly, especially if you have certain risk factors for type 2 diabetes.  Make an appointment with a diet and nutrition specialist (registered dietitian). A registered dietitian can help you make a healthy eating plan and can help you understand portion sizes and food labels. Why are these changes important?  It is possible to prevent or delay type 2 diabetes and related health problems by making lifestyle and nutrition changes.  It can be difficult to recognize signs of type 2 diabetes. The best way to avoid possible damage to your body is to take actions to prevent the disease before you develop symptoms. What can happen if changes are not made?  Your blood glucose levels may keep increasing. Having high blood glucose for a long time is dangerous. Too much glucose in your blood can damage your blood vessels, heart, kidneys, nerves, and eyes.  You may develop prediabetes or type 2 diabetes. Type 2 diabetes can lead to many chronic health problems and complications, such as: ? Heart  disease. ? Stroke. ? Blindness. ? Kidney disease. ? Depression. ? Poor circulation in the feet and legs, which could lead to surgical removal (amputation) in severe cases. Where to find support:  Ask your health care provider to recommend a registered dietitian, diabetes educator, or weight loss program.  Look for local or online weight loss groups.  Join a gym, fitness club, or outdoor activity group, such as a walking club. Where to find more information: To learn more about diabetes and diabetes prevention, visit:  American Diabetes Association (ADA): www.diabetes.[employer] of Diabetes and Digestive and Kidney Diseases: ToyArticles.ca  To learn more about healthy eating, visit:  The U.S. Department of Agriculture Architect), Choose My Plate: http://yates.biz/  Office of Disease Prevention and Health Promotion (ODPHP), Dietary Guidelines: ListingMagazine.si  Summary  You can reduce your risk for type 2 diabetes by increasing your physical activity, eating healthy foods, and losing weight as directed.  Talk with your health care provider about your risk  for type 2 diabetes. Ask about any blood tests or screening tests that you need to have. This information is not intended to replace advice given to you by your health care provider. Make sure you discuss any questions you have with your health care provider. Document Released: 04/09/2016 Document Revised: 05/24/2016 Document Reviewed: 02/07/2016 Elsevier Interactive Patient Education  2018 [employer].   IF you received an x-ray today, you will receive an invoice from [facility]. Please contact [facility] at [phone] with questions or concerns regarding your invoice.   IF you received labwork today, you will receive an invoice from [facility]. Please contact [facility] at [phone] with questions or concerns regarding your invoice.    Our billing staff will not be able to assist you with questions regarding bills from these companies.  You will be contacted with the lab results as soon as they are available. The fastest way to get your results is to activate your My Chart account. Instructions are located on the last page of this paperwork. If you have not heard from [place] regarding the results in 2 weeks, please contact this office.

## 2018-05-16 ENCOUNTER — Telehealth: Payer: Self-pay | Admitting: Physician Assistant

## 2018-05-16 NOTE — Telephone Encounter (Signed)
mychart message sent to pt about changing his apt on 06/18/18 with Barnett Abu.

## 2018-06-18 ENCOUNTER — Ambulatory Visit: Payer: BC Managed Care – PPO | Admitting: Physician Assistant

## 2018-06-20 ENCOUNTER — Ambulatory Visit (INDEPENDENT_AMBULATORY_CARE_PROVIDER_SITE_OTHER): Payer: BC Managed Care – PPO | Admitting: Physician Assistant

## 2018-06-20 ENCOUNTER — Encounter: Payer: Self-pay | Admitting: Physician Assistant

## 2018-06-20 ENCOUNTER — Other Ambulatory Visit: Payer: Self-pay

## 2018-06-20 VITALS — BP 118/70 | HR 65 | Temp 98.0°F | Resp 16 | Ht 69.29 in | Wt 237.8 lb

## 2018-06-20 DIAGNOSIS — I10 Essential (primary) hypertension: Secondary | ICD-10-CM

## 2018-06-20 DIAGNOSIS — N529 Male erectile dysfunction, unspecified: Secondary | ICD-10-CM | POA: Diagnosis not present

## 2018-06-20 DIAGNOSIS — R739 Hyperglycemia, unspecified: Secondary | ICD-10-CM | POA: Diagnosis not present

## 2018-06-20 DIAGNOSIS — Z6834 Body mass index (BMI) 34.0-34.9, adult: Secondary | ICD-10-CM

## 2018-06-20 LAB — HEMOGLOBIN A1C
Est. average glucose Bld gHb Est-mCnc: 131 mg/dL
HEMOGLOBIN A1C: 6.2 % — AB (ref 4.8–5.6)

## 2018-06-20 MED ORDER — AMLODIPINE BESYLATE 5 MG PO TABS: 5.0000 mg | ORAL_TABLET | Freq: Every day | ORAL | 1 refills | Status: DC

## 2018-06-20 MED ORDER — SILDENAFIL CITRATE 100 MG PO TABS: 50.0000 mg | ORAL_TABLET | Freq: Every day | ORAL | 6 refills | Status: DC | PRN

## 2018-06-20 MED ORDER — LISINOPRIL 20 MG PO TABS: 20.0000 mg | ORAL_TABLET | Freq: Every day | ORAL | 1 refills | Status: DC

## 2018-06-20 NOTE — Patient Instructions (Addendum)
Your blood pressure is great today. I have given you refills for medication.   We have checked your A1c today we will have that result back in the week.  I will contact you with results.  For viagra, use lowest effective dose. Start out with half a tablet. The max dose you can take in 24 hours is 100mg . Side effects include but not limited to headache/flushing, blue discoloration of vision, possible vascular steal and risk of cardiac effects if underlying unknown coronary artery disease, and permanent sensorineural hearing loss.  Follow-up in 6 months for reevaluation.   IF you received an x-ray today, you will receive an invoice from [facility]. Please contact [facility] at [phone] with questions or concerns regarding your invoice.   IF you received labwork today, you will receive an invoice from [facility]. Please contact [facility] at [phone] with questions or concerns regarding your invoice.   Our billing staff will not be able to assist you with questions regarding bills from these companies.  You will be contacted with the lab results as soon as they are available. The fastest way to get your results is to activate your My Chart account. Instructions are located on the last page of this paperwork. If you have not heard from us regarding the results in 2 weeks, please contact this office.

## 2018-06-20 NOTE — Progress Notes (Signed)
MRN: [id number] DOB: 1970-08-25  Subjective:   Hunter Adams is a 48 y.o. male presenting for follow up on HTN, elevated glucose,and medication for ED.. Currently managed with amlodipine 5mg  and lisinopril 20mg  daily, added amlodipine at last visit. Patient is not checking blood pressure at home. Reports no side effects. . Denies lightheadedness, dizziness, chronic headache, double vision, chest pain, shortness of breath, heart racing, palpitations, nausea, vomiting, abdominal pain, hematuria, lower leg swelling. Lifestyle: Trying to eat out less. Avoiding excessive salt intake. No current exercise. Denies smoking. Occasional alcohol use.  In terms of elevated glucose, fasting glucose was 108 2 months ago.  He did not want to add on A1C.   But agreed to do so in 3 months.   In terms of EP, we have discussed this issue in the past.  We are tried changing BP medication from HCTZ to amlodipine to see if that helps.  He has not noticed any difference since that change.  At his last physical in February, we collected EKG, which was normal.  Has been having some ED for ~ 5 years. Does not have many morning erections. Notes he can get an erection 50% of the time when aroused. Can maintain it the whole time during sexual intercourse if he gets one. No issues with ejaculation. Denies chest pain, SOB, nausea, and vomiting. Denies smoking. No FH of heart disease. Denies any other aggravating or relieving factors, no other questions or concerns.  ROS per HPI  Hunter Adams has a current medication list which includes the following prescription(s): amlodipine, atorvastatin, lisinopril, multivitamin with minerals, and sildenafil. Also has No Known Allergies.  Hunter Adams  has a past medical history of Allergy and Hypertension. Also  has no past surgical history on file.    Social History   Socioeconomic History  . Marital status: Single    Spouse name: Not on file  . Number of children: 3  . Years of  education: Not on file  . Highest education level: Not on file  Occupational History  . Not on file  Social Needs  . Financial resource strain: Not on file  . Food insecurity:    Worry: Not on file    Inability: Not on file  . Transportation needs:    Medical: Not on file    Non-medical: Not on file  Tobacco Use  . Smoking status: Never Smoker  . Smokeless tobacco: Never Used  Substance and Sexual Activity  . Alcohol use: Yes    Alcohol/week: 0.0 oz    Comment: occasionally   . Drug use: No  . Sexual activity: Yes    Partners: Female    Comment: with multiple partners, does not use condoms always  Lifestyle  . Physical activity:    Days per week: 3 days    Minutes per session: 30 min  . Stress: Not at all  Relationships  . Social connections:    Talks on phone: Twice a week    Gets together: Once a week    Attends religious service: More than 4 times per year    Active member of club or organization: Yes    Attends meetings of clubs or organizations: More than 4 times per year    Relationship status: Never married  . Intimate partner violence:    Fear of current or ex partner: No    Emotionally abused: No    Physically abused: No    Forced sexual activity: No  Other Topics  Concern  . Not on file  Social History Narrative   Pt is from [place], [place]. Has lived in [place] since 1998. Has three children. Lives at home alone. Works at [employer] at [employer].     Objective:   Vitals: BP 118/70   Pulse 65   Temp 98 F (36.7 C)   Resp 16   Ht 5' 9.29" (1.76 m)   Wt 237 lb 12.8 oz (107.9 kg)   SpO2 98%   BMI 34.82 kg/m   Physical Exam  Constitutional: He is oriented to person, place, and time. He appears well-developed and well-nourished. No distress.  HENT:  Head: Normocephalic and atraumatic.  Mouth/Throat: Uvula is midline, oropharynx is clear and moist and mucous membranes are normal. No tonsillar exudate.  Eyes: Pupils are equal,  round, and reactive to light. Conjunctivae and EOM are normal.  Neck: Normal range of motion.  Cardiovascular: Normal rate, regular rhythm, normal heart sounds and intact distal pulses.  Pulmonary/Chest: Effort normal and breath sounds normal. He has no decreased breath sounds. He has no wheezes. He has no rhonchi. He has no rales.  Musculoskeletal:       Right lower leg: He exhibits no swelling.       Left lower leg: He exhibits no swelling.  Neurological: He is alert and oriented to person, place, and time.  Skin: Skin is warm and dry.  Psychiatric: He has a normal mood and affect.  Vitals reviewed.   No results found for this or any previous visit (from the past 24 hour(s)).  Wt Readings from Last 3 Encounters:  06/20/18 237 lb 12.8 oz (107.9 kg)  03/24/18 237 lb 12.8 oz (107.9 kg)  02/20/18 242 lb 9.6 oz (110 kg)    Assessment and Plan :  1. Essential hypertension Well-controlled.  Continue current medication regimen.  Encourage patient to check blood pressure outside office and document these values.  Goal is less than 140/90.  Follow-up in 6 months for reevaluation. - lisinopril (PRINIVIL,ZESTRIL) 20 MG tablet; Take 1 tablet (20 mg total) by mouth daily.  Dispense: 90 tablet; Refill: 1 - amLODipine (NORVASC) 5 MG tablet; Take 1 tablet (5 mg total) by mouth daily.  Dispense: 90 tablet; Refill: 1  2. Hyperglycemia Labs pending. - Hemoglobin A1c  3. Erectile dysfunction, unspecified erectile dysfunction type Patient would like to try medication at this time.viagra Rx given - use lowest effective dose. Side effects discussed (including but not limited to headache/flushing, blue discoloration of vision, possible vascular steal and risk of cardiac effects if underlying unknown coronary artery disease, and permanent sensorineural hearing loss). Understanding expressed. - sildenafil (VIAGRA) 100 MG tablet; Take 0.5-1 tablets (50-100 mg total) by mouth daily as needed for erectile  dysfunction. Max dose in 24 hour period is 100mg .  Dispense: 5 tablet; Refill: 6  4. BMI 34.0-34.9,adult Discussed weight loss.  Patient is about to start getting back in the gym.  Continue with healthy diet.  Benjiman Core, PA-C  [facility] at [facility] 06/20/2018 9:29 AM

## 2018-08-15 ENCOUNTER — Telehealth: Payer: Self-pay | Admitting: Physician Assistant

## 2018-08-15 NOTE — Telephone Encounter (Signed)
Left a voicemail in regards the patients appt with [name] on 12/18/2018. The provider will not be in the office on that day and would need to be rescheduled.

## 2018-12-18 ENCOUNTER — Ambulatory Visit: Payer: BC Managed Care – PPO | Admitting: Physician Assistant

## 2018-12-22 ENCOUNTER — Other Ambulatory Visit: Payer: Self-pay | Admitting: Physician Assistant

## 2018-12-22 DIAGNOSIS — I10 Essential (primary) hypertension: Secondary | ICD-10-CM

## 2018-12-22 NOTE — Telephone Encounter (Signed)
Attempted to call patient regarding his refill for amlodipine. No answer and mail box is full. Unable to leave a message to call and reestablish care with a new provider at [facility]. Former provider, [place]ittany Wiseman. LOV 06/20/18 LR 06/20/18

## 2019-02-20 ENCOUNTER — Encounter: Payer: Self-pay | Admitting: Emergency Medicine

## 2019-02-26 ENCOUNTER — Encounter: Payer: BC Managed Care – PPO | Admitting: Emergency Medicine

## 2019-03-20 ENCOUNTER — Other Ambulatory Visit: Payer: Self-pay

## 2019-03-20 ENCOUNTER — Ambulatory Visit (INDEPENDENT_AMBULATORY_CARE_PROVIDER_SITE_OTHER): Payer: BC Managed Care – PPO | Admitting: Family Medicine

## 2019-03-20 ENCOUNTER — Encounter: Payer: Self-pay | Admitting: Family Medicine

## 2019-03-20 VITALS — BP 132/74 | HR 62 | Temp 98.1°F | Resp 17 | Ht 69.0 in | Wt 240.0 lb

## 2019-03-20 DIAGNOSIS — M25561 Pain in right knee: Secondary | ICD-10-CM

## 2019-03-20 DIAGNOSIS — Z0001 Encounter for general adult medical examination with abnormal findings: Secondary | ICD-10-CM

## 2019-03-20 DIAGNOSIS — Z125 Encounter for screening for malignant neoplasm of prostate: Secondary | ICD-10-CM

## 2019-03-20 DIAGNOSIS — I10 Essential (primary) hypertension: Secondary | ICD-10-CM

## 2019-03-20 DIAGNOSIS — Z131 Encounter for screening for diabetes mellitus: Secondary | ICD-10-CM

## 2019-03-20 DIAGNOSIS — Z0000 Encounter for general adult medical examination without abnormal findings: Secondary | ICD-10-CM

## 2019-03-20 DIAGNOSIS — R739 Hyperglycemia, unspecified: Secondary | ICD-10-CM

## 2019-03-20 DIAGNOSIS — E669 Obesity, unspecified: Secondary | ICD-10-CM | POA: Diagnosis not present

## 2019-03-20 DIAGNOSIS — Z1211 Encounter for screening for malignant neoplasm of colon: Secondary | ICD-10-CM | POA: Diagnosis not present

## 2019-03-20 DIAGNOSIS — M25562 Pain in left knee: Secondary | ICD-10-CM

## 2019-03-20 DIAGNOSIS — Z6835 Body mass index (BMI) 35.0-35.9, adult: Secondary | ICD-10-CM

## 2019-03-20 MED ORDER — AMLODIPINE BESYLATE 5 MG PO TABS: 5.0000 mg | ORAL_TABLET | Freq: Every day | ORAL | 1 refills | Status: DC

## 2019-03-20 MED ORDER — LISINOPRIL 20 MG PO TABS: 20.0000 mg | ORAL_TABLET | Freq: Every day | ORAL | 1 refills | Status: DC

## 2019-03-20 MED ORDER — ATORVASTATIN CALCIUM 20 MG PO TABS: 20.0000 mg | ORAL_TABLET | Freq: Every day | ORAL | 1 refills | Status: DC

## 2019-03-20 NOTE — Patient Instructions (Addendum)
Avoid sugar containing beverages.  Exercise per week minimum recommended.  No med changes for now.  [email or web address] - website for vaccines.  Recheck in 6 months for meds, labs.   See information below on knee pain, and some knee exercises (pick few per day, stretches daily). If any of those cause pain, do not perform them.  If symptoms or not improving in the next 6 to 8 weeks, follow-up for repeat evaluation and possible x-rays if needed   Knee Exercises Ask your health care provider which exercises are safe for you. Do exercises exactly as told by your health care provider and adjust them as directed. It is normal to feel mild stretching, pulling, tightness, or discomfort as you do these exercises, but you should stop right away if you feel sudden pain or your pain gets worse.Do not begin these exercises until told by your health care provider. STRETCHING AND RANGE OF MOTION EXERCISES These exercises warm up your muscles and joints and improve the movement and flexibility of your knee. These exercises also help to relieve pain, numbness, and tingling. Exercise A: Knee Extension, Prone 1. Lie on your abdomen on a bed. 2. Place your left / right knee just beyond the edge of the surface so your knee is not on the bed. You can put a towel under your left / right thigh just above your knee for comfort. 3. Relax your leg muscles and allow gravity to straighten your knee. You should feel a stretch behind your left / right knee. 4. Hold this position for __________ seconds. 5. Scoot up so your knee is supported between repetitions. Repeat __________ times. Complete this stretch __________ times a day. Exercise B: Knee Flexion, Active  1. Lie on your back with both knees straight. If this causes back discomfort, bend your left / right knee so your foot is flat on the floor. 2. Slowly slide your left / right heel back toward your buttocks until you feel a gentle  stretch in the front of your knee or thigh. 3. Hold this position for __________ seconds. 4. Slowly slide your left / right heel back to the starting position. Repeat __________ times. Complete this exercise __________ times a day. Exercise C: Quadriceps, Prone  1. Lie on your abdomen on a firm surface, such as a bed or padded floor. 2. Bend your left / right knee and hold your ankle. If you cannot reach your ankle or pant leg, loop a belt around your foot and grab the belt instead. 3. Gently pull your heel toward your buttocks. Your knee should not slide out to the side. You should feel a stretch in the front of your thigh and knee. 4. Hold this position for __________ seconds. Repeat __________ times. Complete this stretch __________ times a day. Exercise D: Hamstring, Supine 1. Lie on your back. 2. Loop a belt or towel over the ball of your left / right foot. The ball of your foot is on the walking surface, right under your toes. 3. Straighten your left / right knee and slowly pull on the belt to raise your leg until you feel a gentle stretch behind your knee. ? Do not let your left / right knee bend while you do this. ? Keep your other leg flat on the floor. 4. Hold this position for __________ seconds. Repeat __________ times. Complete this stretch __________ times a day. STRENGTHENING EXERCISES These exercises build strength and endurance in your knee. Endurance is the ability to use  your muscles for a long time, even after they get tired. Exercise E: Quadriceps, Isometric  1. Lie on your back with your left / right leg extended and your other knee bent. Put a rolled towel or small pillow under your knee if told by your health care provider. 2. Slowly tense the muscles in the front of your left / right thigh. You should see your kneecap slide up toward your hip or see increased dimpling just above the knee. This motion will push the back of the knee toward the floor. 3. For __________  seconds, keep the muscle as tight as you can without increasing your pain. 4. Relax the muscles slowly and completely. Repeat __________ times. Complete this exercise __________ times a day. Exercise F: Straight Leg Raises - Quadriceps 1. Lie on your back with your left / right leg extended and your other knee bent. 2. Tense the muscles in the front of your left / right thigh. You should see your kneecap slide up or see increased dimpling just above the knee. Your thigh may even shake a bit. 3. Keep these muscles tight as you raise your leg 4-6 inches (10-15 cm) off the floor. Do not let your knee bend. 4. Hold this position for __________ seconds. 5. Keep these muscles tense as you lower your leg. 6. Relax your muscles slowly and completely after each repetition. Repeat __________ times. Complete this exercise __________ times a day. Exercise G: Hamstring, Isometric 1. Lie on your back on a firm surface. 2. Bend your left / right knee approximately __________ degrees. 3. Dig your left / right heel into the surface as if you are trying to pull it toward your buttocks. Tighten the muscles in the back of your thighs to dig as hard as you can without increasing any pain. 4. Hold this position for __________ seconds. 5. Release the tension gradually and allow your muscles to relax completely for __________ seconds after each repetition. Repeat __________ times. Complete this exercise __________ times a day. Exercise H: Hamstring Curls  If told by your health care provider, do this exercise while wearing ankle weights. Begin with __________ weights. Then increase the weight by 1 lb (0.5 kg) increments. Do not wear ankle weights that are more than __________. 1. Lie on your abdomen with your legs straight. 2. Bend your left / right knee as far as you can without feeling pain. Keep your hips flat against the floor. 3. Hold this position for __________ seconds. 4. Slowly lower your leg to the  starting position.  Repeat __________ times. Complete this exercise __________ times a day. Exercise I: Squats (Quadriceps) 1. Stand in front of a table, with your feet and knees pointing straight ahead. You may rest your hands on the table for balance but not for support. 2. Slowly bend your knees and lower your hips like you are going to sit in a chair. ? Keep your weight over your heels, not over your toes. ? Keep your lower legs upright so they are parallel with the table legs. ? Do not let your hips go lower than your knees. ? Do not bend lower than told by your health care provider. ? If your knee pain increases, do not bend as low. 3. Hold the squat position for __________ seconds. 4. Slowly push with your legs to return to standing. Do not use your hands to pull yourself to standing. Repeat __________ times. Complete this exercise __________ times a day. Exercise J: Wall Slides (  Quadriceps)  1. Lean your back against a smooth wall or door while you walk your feet out 18-24 inches (46-61 cm) from it. 2. Place your feet hip-width apart. 3. Slowly slide down the wall or door until your knees bend __________ degrees. Keep your knees over your heels, not over your toes. Keep your knees in line with your hips. 4. Hold for __________ seconds. Repeat __________ times. Complete this exercise __________ times a day. Exercise K: Straight Leg Raises - Hip Abductors 1. Lie on your side with your left / right leg in the top position. Lie so your head, shoulder, knee, and hip line up. You may bend your bottom knee to help you keep your balance. 2. Roll your hips slightly forward so your hips are stacked directly over each other and your left / right knee is facing forward. 3. Leading with your heel, lift your top leg 4-6 inches (10-15 cm). You should feel the muscles in your outer hip lifting. ? Do not let your foot drift forward. ? Do not let your knee roll toward the ceiling. 4. Hold this  position for __________ seconds. 5. Slowly return your leg to the starting position. 6. Let your muscles relax completely after each repetition. Repeat __________ times. Complete this exercise __________ times a day. Exercise L: Straight Leg Raises - Hip Extensors 1. Lie on your abdomen on a firm surface. You can put a pillow under your hips if that is more comfortable. 2. Tense the muscles in your buttocks and lift your left / right leg about 4-6 inches (10-15 cm). Keep your knee straight as you lift your leg. 3. Hold this position for __________ seconds. 4. Slowly lower your leg to the starting position. 5. Let your leg relax completely after each repetition. Repeat __________ times. Complete this exercise __________ times a day. This information is not intended to replace advice given to you by your health care provider. Make sure you discuss any questions you have with your health care provider. Document Released: 10/31/2005 Document Revised: 09/10/2016 Document Reviewed: 10/23/2015 Elsevier Interactive Patient Education  2018 [employer]  Acute Knee Pain, Adult Acute knee pain is sudden and may be caused by damage, swelling, or irritation of the muscles and tissues that support your knee. The injury may result from:  A fall.  An injury to your knee from twisting motions.  A hit to the knee.  Infection. Acute knee pain may go away on its own with time and rest. If it does not, your health care provider may order tests to find the cause of the pain. These may include:  Imaging tests, such as an X-ray, MRI, or ultrasound.  Joint aspiration. In this test, fluid is removed from the knee.  Arthroscopy. In this test, a lighted tube is inserted into the knee and an image is projected onto a TV screen.  Biopsy. In this test, a sample of tissue is removed from the body and studied under a microscope. Follow these instructions at home: Pay attention to any changes in your symptoms.  Take these actions to relieve your pain. If you have a knee sleeve or brace:   Wear the sleeve or brace as told by your health care provider. Remove it only as told by your health care provider.  Loosen the sleeve or brace if your toes tingle, become numb, or turn cold and blue.  Keep the sleeve or brace clean.  If the sleeve or brace is not waterproof: ? Do  not let it get wet. ? Cover it with a watertight covering when you take a bath or shower. Activity  Rest your knee.  Do not do things that cause pain or make pain worse.  Avoid high-impact activities or exercises, such as running, jumping rope, or doing jumping jacks.  Work with a physical therapist to make a safe exercise program, as recommended by your health care provider. Do exercises as told by your physical therapist. Managing pain, stiffness, and swelling   If directed, put ice on the knee: ? Put ice in a plastic bag. ? Place a towel between your skin and the bag. ? Leave the ice on for 20 minutes, 2-3 times a day.  If directed, use an elastic bandage to put pressure (compression) on your injured knee. This may control swelling, give support, and help with discomfort. General instructions  Take over-the-counter and prescription medicines only as told by your health care provider.  Raise (elevate) your knee above the level of your heart when you are sitting or lying down.  Sleep with a pillow under your knee.  Do not use any products that contain nicotine or tobacco, such as cigarettes, e-cigarettes, and chewing tobacco. These can delay healing. If you need help quitting, ask your health care provider.  If you are overweight, work with your health care provider and a dietitian to set a weight-loss goal that is healthy and reasonable for you. Extra weight can put pressure on your knee.  Keep all follow-up visits as told by your health care provider. This is important. Contact a health care provider if:  Your  knee pain continues, changes, or gets worse.  You have a fever along with knee pain.  Your knee feels warm to the touch.  Your knee buckles or locks up. Get help right away if:  Your knee swells, and the swelling becomes worse.  You cannot move your knee.  You have severe pain in your knee. Summary  Acute knee pain can be caused by a fall, an injury, an infection, or damage, swelling, or irritation of the tissues that support your knee.  Your health care provider may perform tests to find out the cause of the pain.  Pay attention to any changes in your symptoms. Relieve your pain with rest, medicines, light activity, and use of ice.  Get help if your pain continues or becomes worse, your knee swells, or you cannot move your knee. This information is not intended to replace advice given to you by your health care provider. Make sure you discuss any questions you have with your health care provider. Document Released: 10/14/2007 Document Revised: 05/29/2018 Document Reviewed: 05/29/2018 Elsevier Interactive Patient Education  2019 [employer].   Keeping you healthy  Get these tests  Blood pressure- Have your blood pressure checked once a year by your healthcare provider.  Normal blood pressure is 120/80  Weight- Have your body mass index (BMI) calculated to screen for obesity.  BMI is a measure of body fat based on height and weight. You can also calculate your own BMI at [email or web address].  Cholesterol- Have your cholesterol checked every year.  Diabetes- Have your blood sugar checked regularly if you have high blood pressure, high cholesterol, have a family history of diabetes or if you are overweight.  Screening for Colon Cancer- Colonoscopy starting at age 50.  Screening may begin sooner depending on your family history and other health conditions. Follow up colonoscopy as directed by your Gastroenterologist.  Screening  for Prostate Cancer- Both blood work (PSA)  and a rectal exam help screen for Prostate Cancer.  Screening begins at age 45 with African-American men and at age 18 with Caucasian men.  Screening may begin sooner depending on your family history.  Take these medicines  Aspirin- One aspirin daily can help prevent Heart disease and Stroke.  Flu shot- Every fall.  Tetanus- Every 10 years.  Zostavax- Once after the age of 16 to prevent Shingles.  Pneumonia shot- Once after the age of 17; if you are younger than 39, ask your healthcare provider if you need a Pneumonia shot.  Take these steps  Don't smoke- If you do smoke, talk to your doctor about quitting.  For tips on how to quit, go to www.smokefree.gov or call 1-800-QUIT-NOW.  Be physically active- Exercise 5 days a week for at least 30 minutes.  If you are not already physically active start slow and gradually work up to 30 minutes of moderate physical activity.  Examples of moderate activity include walking briskly, mowing the yard, dancing, swimming, bicycling, etc.  Eat a healthy diet- Eat a variety of healthy food such as fruits, vegetables, low fat milk, low fat cheese, yogurt, lean meant, poultry, fish, beans, tofu, etc. For more information go to www.thenutritionsource.org  Drink alcohol in moderation- Limit alcohol intake to less than two drinks a day. Never drink and drive.  Dentist- Brush and floss twice daily; visit your dentist twice a year.  Depression- Your emotional health is as important as your physical health. If you're feeling down, or losing interest in things you would normally enjoy please talk to your healthcare provider.  Eye exam- Visit your eye doctor every year.  Safe sex- If you may be exposed to a sexually transmitted infection, use a condom.  Seat belts- Seat belts can save your life; always wear one.  Smoke/Carbon Monoxide detectors- These detectors need to be installed on the appropriate level of your home.  Replace batteries at least once a  year.  Skin cancer- When out in the sun, cover up and use sunscreen 15 SPF or higher.  Violence- If anyone is threatening you, please tell your healthcare provider.  Living Will/ Health care power of attorney- Speak with your healthcare provider and family.   If you have lab work done today you will be contacted with your lab results within the next 2 weeks.  If you have not heard from [place] then please contact us. The fastest way to get your results is to register for My Chart.   IF you received an x-ray today, you will receive an invoice from [facility]. Please contact [facility] at [phone] with questions or concerns regarding your invoice.   IF you received labwork today, you will receive an invoice from [facility]. Please contact [facility] at [phone] with questions or concerns regarding your invoice.   Our billing staff will not be able to assist you with questions regarding bills from these companies.  You will be contacted with the lab results as soon as they are available. The fastest way to get your results is to activate your My Chart account. Instructions are located on the last page of this paperwork. If you have not heard from [place] regarding the results in 2 weeks, please contact this office.

## 2019-03-20 NOTE — Progress Notes (Signed)
Subjective:    Patient ID: Hunter Adams, male    DOB: 05-26-1970, 49 y.o.   MRN: [id number]  HPI Hunter Adams is a 49 y.o. male Presents today for: Chief Complaint  Patient presents with  . Annual Exam    CPE  Here for annual exam, new patient to me, prior followed by [place].  Hypertension: BP Readings from Last 3 Encounters:  03/20/19 132/74  06/20/18 118/70  03/24/18 133/80   Lab Results  Component Value Date   CREATININE 1.24 02/20/2018  Currently on amlodipine 5 mg daily, lisinopril 20 mg daily. No new side effects, no missed doses.  Hyperlipidemia:  Lab Results  Component Value Date   CHOL 182 02/20/2018   HDL 32 (L) 02/20/2018   LDLCALC 130 (H) 02/20/2018   TRIG 102 02/20/2018   CHOLHDL 5.7 (H) 02/20/2018   Lab Results  Component Value Date   ALT 22 02/20/2018   AST 20 02/20/2018   ALKPHOS 107 02/20/2018   BILITOT 0.4 02/20/2018  Lipitor 20 mg daily. No new side effects or missed doses.   Erectile dysfunction: Has been prescribed sildenafil 50 to 100 mg as needed previously.  Never used it. Rare issue.   Obesity: Prior hyperglycemia, range of 85-1 10 over the past 5 years. Body mass index is 35.44 kg/m. Wt Readings from Last 3 Encounters:  03/20/19 240 lb (108.9 kg)  06/20/18 237 lb 12.8 oz (107.9 kg)  03/24/18 237 lb 12.8 oz (107.9 kg)  less exercise recently.  Has cut back on fast food. Still drinking sweet tea or rare soda.   Cancer screening Due for colonoscopy:  Prostate: declines DRE. Agrees to PSA only.   Immunization History  Administered Date(s) Administered  . Influenza,inj,Quad PF,6+ Mos 02/28/2016  . Tdap 06/17/2014  Declined flu vaccine.   Shingles: declines for now.    Depression screen [facility] 2/9 03/20/2019 06/20/2018 03/24/2018 02/20/2018 04/10/2017  Decreased Interest 0 0 0 0 0  Down, Depressed, Hopeless 0 0 0 0 0  PHQ - 2 Score 0 0 0 0 0  Altered sleeping 0 - - - -  Tired, decreased energy 0 - - - -   Change in appetite 0 - - - -  Feeling bad or failure about yourself  0 - - - -  Trouble concentrating 0 - - - -  Moving slowly or fidgety/restless 0 - - - -  Suicidal thoughts 0 - - - -  PHQ-9 Score 0 - - - -  Difficult doing work/chores Not difficult at all - - - -    Visual Acuity Screening   Right eye Left eye Both eyes  Without correction: _0  With correction:     wears contacts.  Last summer appt with optho.   Dental: ever 6 months, recent visit.   Exercise: As above.   Bilateral knee pain.  Off and on since September.  Notices sometimes in the back of his knees, sometimes in the front when driving.  No swelling, no instability, no weakness.  Occasional Tylenol.   Patient Active Problem List   Diagnosis Date Noted  . BMI 34.0-34.9,adult 06/20/2018  . HTN (hypertension) 06/17/2014   Past Medical History:  Diagnosis Date  . Allergy   . Hypertension    No past surgical history on file. No Known Allergies Prior to Admission medications   Medication Sig Start Date End Date Taking? Authorizing Provider  amLODipine (NORVASC) 5 MG tablet TAKE 1 TABLET BY  MOUTH ONCE DAILY 12/25/18  Yes Rutherford Guys, MD  atorvastatin (LIPITOR) 20 MG tablet Take 1 tablet (20 mg total) by mouth daily. 03/03/18  Yes Timmothy Euler, [place] D, PA-C  lisinopril (PRINIVIL,ZESTRIL) 20 MG tablet Take 1 tablet (20 mg total) by mouth daily. 06/20/18  Yes Timmothy Euler, [place] D, PA-C  Multiple Vitamin (MULTIVITAMIN WITH MINERALS) TABS tablet Take 1 tablet by mouth daily.   Yes [provider]  sildenafil (VIAGRA) 100 MG tablet Take 0.5-1 tablets (50-100 mg total) by mouth daily as needed for erectile dysfunction. Max dose in 24 hour period is 168m. 06/20/18  Yes WLeonie Douglas PA-C   Social History   Socioeconomic History  . Marital status: Single    Spouse name: Not on file  . Number of children: 3  . Years of education: Not on file  . Highest education level: Not on file   Occupational History  . Not on file  Social Needs  . Financial resource strain: Not on file  . Food insecurity:    Worry: Not on file    Inability: Not on file  . Transportation needs:    Medical: Not on file    Non-medical: Not on file  Tobacco Use  . Smoking status: Never Smoker  . Smokeless tobacco: Never Used  Substance and Sexual Activity  . Alcohol use: Yes    Alcohol/week: 0.0 standard drinks    Comment: occasionally   . Drug use: No  . Sexual activity: Yes    Partners: Female    Comment: with multiple partners, does not use condoms always  Lifestyle  . Physical activity:    Days per week: 3 days    Minutes per session: 30 min  . Stress: Not at all  Relationships  . Social connections:    Talks on phone: Twice a week    Gets together: Once a week    Attends religious service: More than 4 times per year    Active member of club or organization: Yes    Attends meetings of clubs or organizations: More than 4 times per year    Relationship status: Never married  . Intimate partner violence:    Fear of current or ex partner: No    Emotionally abused: No    Physically abused: No    Forced sexual activity: No  Other Topics Concern  . Not on file  Social History Narrative   [place] three [place]. Lives at home alone. Works at [employer]     Review of Systems 13 point review of systems per patient health survey noted.  Negative other than as indicated above or in HPI.      Objective:   Physical Exam Vitals signs reviewed.  Constitutional:      Appearance: He is well-developed.  HENT:     Head: Normocephalic and atraumatic.     Right Ear: External ear normal.     Left Ear: External ear normal.  Eyes:     Conjunctiva/sclera: Conjunctivae normal.     Pupils: Pupils are equal, round, and reactive to light.  Neck:     Musculoskeletal: Normal range of motion and neck supple.      Thyroid: No thyromegaly.  Cardiovascular:     Rate and Rhythm: Normal rate and regular rhythm.     Heart sounds: Normal heart sounds.  Pulmonary:     Effort: Pulmonary effort  is normal. No respiratory distress.     Breath sounds: Normal breath sounds. No wheezing.  Abdominal:     General: There is no distension.     Palpations: Abdomen is soft.     Tenderness: There is no abdominal tenderness.  Musculoskeletal: Normal range of motion.        General: No tenderness.     Right knee: Normal. He exhibits no swelling, no effusion, no LCL laxity, no bony tenderness and no MCL laxity. No tenderness found.     Left knee: Normal. He exhibits no swelling, no effusion, no LCL laxity, no bony tenderness, normal meniscus and no MCL laxity. No tenderness found.  Lymphadenopathy:     Cervical: No cervical adenopathy.  Skin:    General: Skin is warm and dry.  Neurological:     Mental Status: He is alert and oriented to person, place, and time.     Deep Tendon Reflexes: Reflexes are normal and symmetric.  Psychiatric:        Behavior: Behavior normal.    Vitals:   03/20/19 0959  BP: 132/74  Pulse: 62  Resp: 17  Temp: 98.1 F (36.7 C)  TempSrc: Oral  SpO2: 98%  Weight: 240 lb (108.9 kg)  Height: _0  (1.753 m)        Assessment & Plan:    Hunter Adams is a 49 y.o. male Annual physical exam  - -anticipatory guidance as below in AVS, screening labs above. Health maintenance items as above in HPI discussed/recommended as applicable.   Class 2 obesity without serious comorbidity with body mass index (BMI) of 35.0 to 35.9 in adult, unspecified obesity type - Plan: Hemoglobin A1c Screening for diabetes mellitus - Plan: Hemoglobin A1c Hyperglycemia - Plan: Lipid panel, CMP14+EGFR  - check CMP and A1c.  Diet, exercise discussed.   Colon cancer screening - Plan: Ambulatory referral to Gastroenterology  Screening for prostate cancer - Plan: PSA  -We discussed pros and cons of  prostate cancer screening, and after this discussion, he chose to have screening done with PSA only.  Limitations in partial testing discussed.  Essential hypertension - Plan: amLODipine (NORVASC) 5 MG tablet, lisinopril (PRINIVIL,ZESTRIL) 20 MG tablet  -  Stable, tolerating current regimen. Medications refilled. Labs pending as above.   Pain in both knees, unspecified chronicity  -No specific focal pain or concerns noted on exam.  May have a component of tight hamstrings, may have some component of patellofemoral pain based on his symptoms.  Less likely degenerative joint disease.  Handout given on home exercises and discussed range of motion multiple times per day.  Avoid any exercises that cause pain.  Recheck in 6 to 8 weeks if symptoms persist.  Meds ordered this encounter  Medications  . atorvastatin (LIPITOR) 20 MG tablet    Sig: Take 1 tablet (20 mg total) by mouth daily at 6 PM.    Dispense:  90 tablet    Refill:  1  . amLODipine (NORVASC) 5 MG tablet    Sig: Take 1 tablet (5 mg total) by mouth daily.    Dispense:  90 tablet    Refill:  1  . lisinopril (PRINIVIL,ZESTRIL) 20 MG tablet    Sig: Take 1 tablet (20 mg total) by mouth daily.    Dispense:  90 tablet    Refill:  1   Patient Instructions   Avoid sugar containing beverages.  Exercise 132mns per week minimum recommended.  No med changes for now.  Hhttp://hayes.com/-  website for vaccines.  Recheck in 6 months for meds, labs.   See information below on knee pain, and some knee exercises (pick few per day, stretches daily). If any of those cause pain, do not perform them.  If symptoms or not improving in the next 6 to 8 weeks, follow-up for repeat evaluation and possible x-rays if needed   Knee Exercises Ask your health care provider which exercises are safe for you. Do exercises exactly as told by your health care provider and adjust them as directed. It is normal to feel mild stretching,  pulling, tightness, or discomfort as you do these exercises, but you should stop right away if you feel sudden pain or your pain gets worse.Do not begin these exercises until told by your health care provider. STRETCHING AND RANGE OF MOTION EXERCISES These exercises warm up your muscles and joints and improve the movement and flexibility of your knee. These exercises also help to relieve pain, numbness, and tingling. Exercise A: Knee Extension, Prone 1. Lie on your abdomen on a bed. 2. Place your left / right knee just beyond the edge of the surface so your knee is not on the bed. You can put a towel under your left / right thigh just above your knee for comfort. 3. Relax your leg muscles and allow gravity to straighten your knee. You should feel a stretch behind your left / right knee. 4. Hold this position for __________ seconds. 5. Scoot up so your knee is supported between repetitions. Repeat __________ times. Complete this stretch __________ times a day. Exercise B: Knee Flexion, Active  1. Lie on your back with both knees straight. If this causes back discomfort, bend your left / right knee so your foot is flat on the floor. 2. Slowly slide your left / right heel back toward your buttocks until you feel a gentle stretch in the front of your knee or thigh. 3. Hold this position for __________ seconds. 4. Slowly slide your left / right heel back to the starting position. Repeat __________ times. Complete this exercise __________ times a day. Exercise C: Quadriceps, Prone  1. Lie on your abdomen on a firm surface, such as a bed or padded floor. 2. Bend your left / right knee and hold your ankle. If you cannot reach your ankle or pant leg, loop a belt around your foot and grab the belt instead. 3. Gently pull your heel toward your buttocks. Your knee should not slide out to the side. You should feel a stretch in the front of your thigh and knee. 4. Hold this position for __________ seconds.  Repeat __________ times. Complete this stretch __________ times a day. Exercise D: Hamstring, Supine 1. Lie on your back. 2. Loop a belt or towel over the ball of your left / right foot. The ball of your foot is on the walking surface, right under your toes. 3. Straighten your left / right knee and slowly pull on the belt to raise your leg until you feel a gentle stretch behind your knee. ? Do not let your left / right knee bend while you do this. ? Keep your other leg flat on the floor. 4. Hold this position for __________ seconds. Repeat __________ times. Complete this stretch __________ times a day. STRENGTHENING EXERCISES These exercises build strength and endurance in your knee. Endurance is the ability to use your muscles for a long time, even after they get tired. Exercise E: Quadriceps, Isometric  1. Lie on  your back with your left / right leg extended and your other knee bent. Put a rolled towel or small pillow under your knee if told by your health care provider. 2. Slowly tense the muscles in the front of your left / right thigh. You should see your kneecap slide up toward your hip or see increased dimpling just above the knee. This motion will push the back of the knee toward the floor. 3. For __________ seconds, keep the muscle as tight as you can without increasing your pain. 4. Relax the muscles slowly and completely. Repeat __________ times. Complete this exercise __________ times a day. Exercise F: Straight Leg Raises - Quadriceps 1. Lie on your back with your left / right leg extended and your other knee bent. 2. Tense the muscles in the front of your left / right thigh. You should see your kneecap slide up or see increased dimpling just above the knee. Your thigh may even shake a bit. 3. Keep these muscles tight as you raise your leg 4-6 inches (10-15 cm) off the floor. Do not let your knee bend. 4. Hold this position for __________ seconds. 5. Keep these muscles tense as  you lower your leg. 6. Relax your muscles slowly and completely after each repetition. Repeat __________ times. Complete this exercise __________ times a day. Exercise G: Hamstring, Isometric 1. Lie on your back on a firm surface. 2. Bend your left / right knee approximately __________ degrees. 3. Dig your left / right heel into the surface as if you are trying to pull it toward your buttocks. Tighten the muscles in the back of your thighs to dig as hard as you can without increasing any pain. 4. Hold this position for __________ seconds. 5. Release the tension gradually and allow your muscles to relax completely for __________ seconds after each repetition. Repeat __________ times. Complete this exercise __________ times a day. Exercise H: Hamstring Curls  If told by your health care provider, do this exercise while wearing ankle weights. Begin with __________ weights. Then increase the weight by 1 lb (0.5 kg) increments. Do not wear ankle weights that are more than __________. 1. Lie on your abdomen with your legs straight. 2. Bend your left / right knee as far as you can without feeling pain. Keep your hips flat against the floor. 3. Hold this position for __________ seconds. 4. Slowly lower your leg to the starting position.  Repeat __________ times. Complete this exercise __________ times a day. Exercise I: Squats (Quadriceps) 1. Stand in front of a table, with your feet and knees pointing straight ahead. You may rest your hands on the table for balance but not for support. 2. Slowly bend your knees and lower your hips like you are going to sit in a chair. ? Keep your weight over your heels, not over your toes. ? Keep your lower legs upright so they are parallel with the table legs. ? Do not let your hips go lower than your knees. ? Do not bend lower than told by your health care provider. ? If your knee pain increases, do not bend as low. 3. Hold the squat position for __________  seconds. 4. Slowly push with your legs to return to standing. Do not use your hands to pull yourself to standing. Repeat __________ times. Complete this exercise __________ times a day. Exercise J: Wall Slides (Quadriceps)  1. Lean your back against a smooth wall or door while you walk your feet out 18-24  inches (46-61 cm) from it. 2. Place your feet hip-width apart. 3. Slowly slide down the wall or door until your knees bend __________ degrees. Keep your knees over your heels, not over your toes. Keep your knees in line with your hips. 4. Hold for __________ seconds. Repeat __________ times. Complete this exercise __________ times a day. Exercise K: Straight Leg Raises - Hip Abductors 1. Lie on your side with your left / right leg in the top position. Lie so your head, shoulder, knee, and hip line up. You may bend your bottom knee to help you keep your balance. 2. Roll your hips slightly forward so your hips are stacked directly over each other and your left / right knee is facing forward. 3. Leading with your heel, lift your top leg 4-6 inches (10-15 cm). You should feel the muscles in your outer hip lifting. ? Do not let your foot drift forward. ? Do not let your knee roll toward the ceiling. 4. Hold this position for __________ seconds. 5. Slowly return your leg to the starting position. 6. Let your muscles relax completely after each repetition. Repeat __________ times. Complete this exercise __________ times a day. Exercise L: Straight Leg Raises - Hip Extensors 1. Lie on your abdomen on a firm surface. You can put a pillow under your hips if that is more comfortable. 2. Tense the muscles in your buttocks and lift your left / right leg about 4-6 inches (10-15 cm). Keep your knee straight as you lift your leg. 3. Hold this position for __________ seconds. 4. Slowly lower your leg to the starting position. 5. Let your leg relax completely after each repetition. Repeat __________ times.  Complete this exercise __________ times a day. This information is not intended to replace advice given to you by your health care provider. Make sure you discuss any questions you have with your health care provider. Document Released: 10/31/2005 Document Revised: 09/10/2016 Document Reviewed: 10/23/2015 Elsevier Interactive Patient Education  2018 [place].  Acute Knee Pain, Adult Acute knee pain is sudden and may be caused by damage, swelling, or irritation of the muscles and tissues that support your knee. The injury may result from:  A fall.  An injury to your knee from twisting motions.  A hit to the knee.  Infection. Acute knee pain may go away on its own with time and rest. If it does not, your health care provider may order tests to find the cause of the pain. These may include:  Imaging tests, such as an X-ray, MRI, or ultrasound.  Joint aspiration. In this test, fluid is removed from the knee.  Arthroscopy. In this test, a lighted tube is inserted into the knee and an image is projected onto a TV screen.  Biopsy. In this test, a sample of tissue is removed from the body and studied under a microscope. Follow these instructions at home: Pay attention to any changes in your symptoms. Take these actions to relieve your pain. If you have a knee sleeve or brace:   Wear the sleeve or brace as told by your health care provider. Remove it only as told by your health care provider.  Loosen the sleeve or brace if your toes tingle, become numb, or turn cold and blue.  Keep the sleeve or brace clean.  If the sleeve or brace is not waterproof: ? Do not let it get wet. ? Cover it with a watertight covering when you take a bath or shower. Activity  Rest your knee.  Do not do things that cause pain or make pain worse.  Avoid high-impact activities or exercises, such as running, jumping rope, or doing jumping jacks.  Work with a physical therapist to make a safe exercise  program, as recommended by your health care provider. Do exercises as told by your physical therapist. Managing pain, stiffness, and swelling   If directed, put ice on the knee: ? Put ice in a plastic bag. ? Place a towel between your skin and the bag. ? Leave the ice on for 20 minutes, 2-3 times a day.  If directed, use an elastic bandage to put pressure (compression) on your injured knee. This may control swelling, give support, and help with discomfort. General instructions  Take over-the-counter and prescription medicines only as told by your health care provider.  Raise (elevate) your knee above the level of your heart when you are sitting or lying down.  Sleep with a pillow under your knee.  Do not use any products that contain nicotine or tobacco, such as cigarettes, e-cigarettes, and chewing tobacco. These can delay healing. If you need help quitting, ask your health care provider.  If you are overweight, work with your health care provider and a dietitian to set a weight-loss goal that is healthy and reasonable for you. Extra weight can put pressure on your knee.  Keep all follow-up visits as told by your health care provider. This is important. Contact a health care provider if:  Your knee pain continues, changes, or gets worse.  You have a fever along with knee pain.  Your knee feels warm to the touch.  Your knee buckles or locks up. Get help right away if:  Your knee swells, and the swelling becomes worse.  You cannot move your knee.  You have severe pain in your knee. Summary  Acute knee pain can be caused by a fall, an injury, an infection, or damage, swelling, or irritation of the tissues that support your knee.  Your health care provider may perform tests to find out the cause of the pain.  Pay attention to any changes in your symptoms. Relieve your pain with rest, medicines, light activity, and use of ice.  Get help if your pain continues or becomes  worse, your knee swells, or you cannot move your knee. This information is not intended to replace advice given to you by your health care provider. Make sure you discuss any questions you have with your health care provider. Document Released: 10/14/2007 Document Revised: 05/29/2018 Document Reviewed: 05/29/2018 Elsevier Interactive Patient Education  2019 [place] you healthy  Get these tests  Blood pressure- Have your blood pressure checked once a year by your healthcare provider.  Normal blood pressure is 120/80  Weight- Have your body mass index (BMI) calculated to screen for obesity.  BMI is a measure of body fat based on height and weight. You can also calculate your own BMI at ViewBanking.si.  Cholesterol- Have your cholesterol checked every year.  Diabetes- Have your blood sugar checked regularly if you have high blood pressure, high cholesterol, have a family history of diabetes or if you are overweight.  Screening for Colon Cancer- Colonoscopy starting at age 4.  Screening may begin sooner depending on your family history and other health conditions. Follow up colonoscopy as directed by your Gastroenterologist.  Screening for Prostate Cancer- Both blood work (PSA) and a rectal exam help screen for Prostate Cancer.  Screening begins at  age 31 with African-American men and at age 47 with Caucasian men.  Screening may begin sooner depending on your family history.  Take these medicines  Aspirin- One aspirin daily can help prevent Heart disease and Stroke.  Flu shot- Every fall.  Tetanus- Every 10 years.  Zostavax- Once after the age of 86 to prevent Shingles.  Pneumonia shot- Once after the age of 67; if you are younger than 57, ask your healthcare provider if you need a Pneumonia shot.  Take these steps  Don't smoke- If you do smoke, talk to your doctor about quitting.  For tips on how to quit, go to www.smokefree.gov or call 1-800-QUIT-NOW.   Be physically active- Exercise 5 days a week for at least 30 minutes.  If you are not already physically active start slow and gradually work up to 30 minutes of moderate physical activity.  Examples of moderate activity include walking briskly, mowing the yard, dancing, swimming, bicycling, etc.  Eat a healthy diet- Eat a variety of healthy food such as fruits, vegetables, low fat milk, low fat cheese, yogurt, lean meant, poultry, fish, beans, tofu, etc. For more information go to www.thenutritionsource.org  Drink alcohol in moderation- Limit alcohol intake to less than two drinks a day. Never drink and drive.  Dentist- Brush and floss twice daily; visit your dentist twice a year.  Depression- Your emotional health is as important as your physical health. If you're feeling down, or losing interest in things you would normally enjoy please talk to your healthcare provider.  Eye exam- Visit your eye doctor every year.  Safe sex- If you may be exposed to a sexually transmitted infection, use a condom.  Seat belts- Seat belts can save your life; always wear one.  Smoke/Carbon Monoxide detectors- These detectors need to be installed on the appropriate level of your home.  Replace batteries at least once a year.  Skin cancer- When out in the sun, cover up and use sunscreen 15 SPF or higher.  Violence- If anyone is threatening you, please tell your healthcare provider.  Living Will/ Health care power of attorney- Speak with your healthcare provider and family.   If you have lab work done today you will be contacted with your lab results within the next 2 weeks.  If you have not heard from [place] then please contact us. The fastest way to get your results is to register for My Chart.   IF you received an x-ray today, you will receive an invoice from [facility]. Please contact [facility] at [phone] with questions or concerns regarding your invoice.   IF you received labwork  today, you will receive an invoice from [facility]. Please contact [facility] at [phone] with questions or concerns regarding your invoice.   Our billing staff will not be able to assist you with questions regarding bills from these companies.  You will be contacted with the lab results as soon as they are available. The fastest way to get your results is to activate your My Chart account. Instructions are located on the last page of this paperwork. If you have not heard from [place] regarding the results in 2 weeks, please contact this office.       Signed,   Merri Ray, MD [facility] at [place].  03/22/19 10:53 PM

## 2019-03-21 LAB — LIPID PANEL
CHOLESTEROL TOTAL: 119 mg/dL (ref 100–199)
Chol/HDL Ratio: 3.6 ratio (ref 0.0–5.0)
HDL: 33 mg/dL — ABNORMAL LOW (ref >39)
LDL CALC: 71 mg/dL (ref 0–99)
TRIGLYCERIDES: 73 mg/dL (ref 0–149)
VLDL Cholesterol Cal: 15 mg/dL (ref 5–40)

## 2019-03-21 LAB — CMP14+EGFR
A/G RATIO: 1.8 (ref 1.2–2.2)
ALBUMIN: 4.6 g/dL (ref 4.0–5.0)
ALT: 35 IU/L (ref 0–44)
AST: 25 IU/L (ref 0–40)
Alkaline Phosphatase: 120 IU/L — ABNORMAL HIGH (ref 39–117)
BUN / CREAT RATIO: 10 (ref 9–20)
BUN: 11 mg/dL (ref 6–24)
Bilirubin Total: 0.6 mg/dL (ref 0.0–1.2)
CO2: 22 mmol/L (ref 20–29)
CREATININE: 1.08 mg/dL (ref 0.76–1.27)
Calcium: 10.1 mg/dL (ref 8.7–10.2)
Chloride: 103 mmol/L (ref 96–106)
GFR, EST AFRICAN AMERICAN: 92 mL/min/{1.73_m2} (ref >59)
GFR, EST NON AFRICAN AMERICAN: 80 mL/min/{1.73_m2} (ref >59)
GLOBULIN, TOTAL: 2.6 g/dL (ref 1.5–4.5)
Glucose: 90 mg/dL (ref 65–99)
Potassium: 4.2 mmol/L (ref 3.5–5.2)
Sodium: 142 mmol/L (ref 134–144)
Total Protein: 7.2 g/dL (ref 6.0–8.5)

## 2019-03-21 LAB — PSA: PROSTATE SPECIFIC AG, SERUM: 3 ng/mL (ref 0.0–4.0)

## 2019-03-21 LAB — HEMOGLOBIN A1C
ESTIMATED AVERAGE GLUCOSE: 128 mg/dL
Hgb A1c MFr Bld: 6.1 % — ABNORMAL HIGH (ref 4.8–5.6)

## 2019-03-24 ENCOUNTER — Encounter: Payer: Self-pay | Admitting: Family Medicine

## 2019-05-13 ENCOUNTER — Encounter (HOSPITAL_COMMUNITY): Payer: Self-pay | Admitting: Emergency Medicine

## 2019-05-13 ENCOUNTER — Other Ambulatory Visit: Payer: Self-pay

## 2019-05-13 ENCOUNTER — Emergency Department (HOSPITAL_COMMUNITY)
Admission: EM | Admit: 2019-05-13 | Discharge: 2019-05-13 | Disposition: A | Discharge status: Home / Self Care | Payer: BC Managed Care – PPO | Attending: Emergency Medicine | Admitting: Emergency Medicine

## 2019-05-13 DIAGNOSIS — Y999 Unspecified external cause status: Secondary | ICD-10-CM | POA: Insufficient documentation

## 2019-05-13 DIAGNOSIS — S39012A Strain of muscle, fascia and tendon of lower back, initial encounter: Secondary | ICD-10-CM | POA: Diagnosis not present

## 2019-05-13 DIAGNOSIS — Y929 Unspecified place or not applicable: Secondary | ICD-10-CM | POA: Insufficient documentation

## 2019-05-13 DIAGNOSIS — M545 Low back pain: Secondary | ICD-10-CM | POA: Diagnosis present

## 2019-05-13 DIAGNOSIS — Y939 Activity, unspecified: Secondary | ICD-10-CM | POA: Diagnosis not present

## 2019-05-13 DIAGNOSIS — I10 Essential (primary) hypertension: Secondary | ICD-10-CM | POA: Diagnosis not present

## 2019-05-13 DIAGNOSIS — X58XXXA Exposure to other specified factors, initial encounter: Secondary | ICD-10-CM | POA: Insufficient documentation

## 2019-05-13 MED ORDER — CYCLOBENZAPRINE HCL 10 MG PO TABS
10.0000 mg | ORAL_TABLET | Freq: Once | ORAL | Status: AC
  Administered 2019-05-13: 18:00:00 10 mg via ORAL
  Filled 2019-05-13: qty 1

## 2019-05-13 MED ORDER — DICLOFENAC SODIUM 75 MG PO TBEC: 75.0000 mg | DELAYED_RELEASE_TABLET | Freq: Two times a day (BID) | ORAL | 0 refills | Status: DC

## 2019-05-13 MED ORDER — KETOROLAC TROMETHAMINE 10 MG PO TABS
10.0000 mg | ORAL_TABLET | Freq: Once | ORAL | Status: AC
  Administered 2019-05-13: 10 mg via ORAL
  Filled 2019-05-13: qty 1

## 2019-05-13 MED ORDER — CYCLOBENZAPRINE HCL 10 MG PO TABS: 10.0000 mg | ORAL_TABLET | Freq: Three times a day (TID) | ORAL | 0 refills | Status: DC | PRN

## 2019-05-13 MED ORDER — TRAMADOL HCL 50 MG PO TABS: 50.0000 mg | ORAL_TABLET | Freq: Four times a day (QID) | ORAL | 0 refills | Status: DC | PRN

## 2019-05-13 NOTE — Discharge Instructions (Signed)
Continue to apply ice packs on/off to your back.  Follow-up with your primary provider or return to ER for any worsening symtpoms

## 2019-05-13 NOTE — ED Provider Notes (Signed)
[facility] EMERGENCY DEPARTMENT Provider Note   CSN: [id number] Arrival date & time: 05/13/19  1611    History   Chief Complaint Chief Complaint  Patient presents with  . Back Pain    HPI Hunter Adams is a 49 y.o. male.     HPI   Hunter Adams is a 49 y.o. male who presents to the Emergency Department complaining of left sided low back pain for one day.  States he was mowing the lawn couple days ago and woke up the following day with aching pain that he describes as constant, but worse with walking and standing upright.  Pain improves with direct pressure to the area and leaning slightly forward.  He denies known injury, urine or bowel changes, abdominal pain, fever, chills, pain, numbness or weakness of the lower extremities.  He has tried ice and stretching with minimal relief.   Past Medical History:  Diagnosis Date  . Allergy   . Hypertension     Patient Active Problem List   Diagnosis Date Noted  . BMI 34.0-34.9,adult 06/20/2018  . HTN (hypertension) 06/17/2014    History reviewed. No pertinent surgical history.    Home Medications    Prior to Admission medications   Medication Sig Start Date End Date Taking? Authorizing Provider  amLODipine (NORVASC) 5 MG tablet Take 1 tablet (5 mg total) by mouth daily. 03/20/19   Shade FloodGreene, Jeffrey R, MD  atorvastatin (LIPITOR) 20 MG tablet Take 1 tablet (20 mg total) by mouth daily at 6 PM. 03/20/19   Shade FloodGreene, Jeffrey R, MD  lisinopril (PRINIVIL,ZESTRIL) 20 MG tablet Take 1 tablet (20 mg total) by mouth daily. 03/20/19   Shade FloodGreene, Jeffrey R, MD  Multiple Vitamin (MULTIVITAMIN WITH MINERALS) TABS tablet Take 1 tablet by mouth daily.    [provider]  sildenafil (VIAGRA) 100 MG tablet Take 0.5-1 tablets (50-100 mg total) by mouth daily as needed for erectile dysfunction. Max dose in 24 hour period is 100mg . 06/20/18   Magdalene RiverWiseman, Brittany D, PA-C    Family History Family History  Problem Relation Age of Onset  .  Hypertension Mother   . Diabetes Mother   . Hypertension Father   . Heart disease Father   . Hypertension Maternal Grandmother   . Hypertension Maternal Grandfather   . Hypertension Paternal Grandmother   . Hypertension Paternal Grandfather     Social History Social History   Tobacco Use  . Smoking status: Never Smoker  . Smokeless tobacco: Never Used  Substance Use Topics  . Alcohol use: Yes    Alcohol/week: 0.0 standard drinks    Comment: occasionally   . Drug use: No     Allergies   Patient has no known allergies.   Review of Systems Review of Systems  Constitutional: Negative for fever.  Respiratory: Negative for shortness of breath.   Cardiovascular: Negative for chest pain.  Gastrointestinal: Negative for abdominal pain, constipation, diarrhea and vomiting.  Genitourinary: Negative for decreased urine volume, difficulty urinating, dysuria, flank pain and hematuria.  Musculoskeletal: Positive for back pain. Negative for joint swelling and neck pain.  Skin: Negative for rash.  Neurological: Negative for weakness and numbness.     Physical Exam Updated Vital Signs BP 139/77 (BP Location: Left Arm)   Pulse 79   Temp 99.4 F (37.4 C) (Oral)   Resp 18   Ht 5\' 11"  (1.803 m)   Wt 108 kg   SpO2 99%   BMI 33.21 kg/m   Physical Exam  Vitals signs and nursing note reviewed.  Constitutional:      General: He is not in acute distress.    Appearance: He is well-developed.  HENT:     Mouth/Throat:     Mouth: Mucous membranes are moist.     Pharynx: Oropharynx is clear.  Neck:     Musculoskeletal: Normal range of motion and neck supple.  Cardiovascular:     Rate and Rhythm: Normal rate and regular rhythm.     Pulses: Normal pulses.     Comments: DP pulses are strong and palpable bilaterally Pulmonary:     Effort: Pulmonary effort is normal. No respiratory distress.     Breath sounds: Normal breath sounds.  Abdominal:     General: There is no distension.      Palpations: Abdomen is soft.     Tenderness: There is no abdominal tenderness.  Musculoskeletal:        General: Tenderness present.     Lumbar back: He exhibits tenderness and pain. He exhibits normal range of motion, no swelling, no deformity, no laceration and normal pulse.     Right lower leg: No edema.     Left lower leg: No edema.     Comments: ttp of the left lower lumbar paraspinal muscles.  No spinal tenderness.  Pt has 5/5 strength against resistance of bilateral lower extremities.  Neg SLR bilaterally   Skin:    General: Skin is warm.     Capillary Refill: Capillary refill takes less than 2 seconds.     Findings: No rash.  Neurological:     General: No focal deficit present.     Mental Status: He is alert and oriented to person, place, and time.     Sensory: No sensory deficit.     Motor: No abnormal muscle tone.     Gait: Gait normal.     Deep Tendon Reflexes:     Reflex Scores:      Patellar reflexes are 2+ on the right side and 2+ on the left side.      Achilles reflexes are 2+ on the right side and 2+ on the left side.     ED Treatments / Results  Labs (all labs ordered are listed, but only abnormal results are displayed) Labs Reviewed - No data to display  EKG None  Radiology No results found.  Procedures Procedures (including critical care time)  Medications Ordered in ED Medications  cyclobenzaprine (FLEXERIL) tablet 10 mg (has no administration in time range)  ketorolac (TORADOL) tablet 10 mg (has no administration in time range)     Initial Impression / Assessment and Plan / ED Course  I have reviewed the triage vital signs and the nursing notes.  Pertinent labs & imaging results that were available during my care of the patient were reviewed by me and considered in my medical decision making (see chart for details).        Pt with likely lumbar strain.  No focal neuro deficits, NV intact.  Ambulates with a slow but steady gait. No  history of fall or neurological deficit to indicate need for imaging at this time.  He agrees to tx plan and close out pt f/u if needed.  Return precautions discussed.    Final Clinical Impressions(s) / ED Diagnoses   Final diagnoses:  Strain of lumbar region, initial encounter    ED Discharge Orders    None       Pauline Aus, PA-C 05/13/19 1728  Raeford Razor, MD 05/13/19 1949

## 2019-05-13 NOTE — ED Triage Notes (Signed)
Patient complains of left lower back pain that started yesterday.

## 2019-08-21 ENCOUNTER — Encounter: Payer: Self-pay | Admitting: Family Medicine

## 2019-08-21 ENCOUNTER — Other Ambulatory Visit: Payer: Self-pay | Admitting: Emergency Medicine

## 2019-08-21 ENCOUNTER — Other Ambulatory Visit: Payer: Self-pay | Admitting: Family Medicine

## 2019-08-21 DIAGNOSIS — I10 Essential (primary) hypertension: Secondary | ICD-10-CM

## 2019-08-21 MED ORDER — LISINOPRIL 20 MG PO TABS: 20.0000 mg | ORAL_TABLET | Freq: Every day | ORAL | 1 refills | Status: DC

## 2019-08-21 NOTE — Telephone Encounter (Signed)
Pt called stating he is out of the medication. Please advise.  

## 2019-08-21 NOTE — Telephone Encounter (Signed)
Medication: lisinopril (PRINIVIL,ZESTRIL) 20 MG tablet     Patient was advised by pharmacy to contact office for refill.     Pharmacy:  [facility] [address], [place] [phone] (Phone) [phone] (Fax)

## 2019-08-24 ENCOUNTER — Encounter: Payer: Self-pay | Admitting: Family Medicine

## 2019-08-24 ENCOUNTER — Ambulatory Visit: Payer: BC Managed Care – PPO | Admitting: Family Medicine

## 2019-08-24 ENCOUNTER — Other Ambulatory Visit: Payer: Self-pay

## 2019-08-24 VITALS — BP 120/76 | HR 64 | Temp 98.5°F | Ht 68.5 in | Wt 236.2 lb

## 2019-08-24 DIAGNOSIS — R7303 Prediabetes: Secondary | ICD-10-CM

## 2019-08-24 DIAGNOSIS — E785 Hyperlipidemia, unspecified: Secondary | ICD-10-CM | POA: Diagnosis not present

## 2019-08-24 DIAGNOSIS — I10 Essential (primary) hypertension: Secondary | ICD-10-CM

## 2019-08-24 MED ORDER — ATORVASTATIN CALCIUM 20 MG PO TABS: 20.0000 mg | ORAL_TABLET | Freq: Every day | ORAL | 1 refills | Status: DC

## 2019-08-24 MED ORDER — AMLODIPINE BESYLATE 5 MG PO TABS: 5.0000 mg | ORAL_TABLET | Freq: Every day | ORAL | 1 refills | Status: DC

## 2019-08-24 MED ORDER — LISINOPRIL 20 MG PO TABS: 20.0000 mg | ORAL_TABLET | Freq: Every day | ORAL | 1 refills | Status: DC

## 2019-08-24 NOTE — Patient Instructions (Addendum)
   Keep up the good work with exercise and watching diet.  Breakfast every day is a great start and can help metabolism as well.  Watch portion sizes for the other meals.    I will let you know the results of your lab work within the next 2 weeks.  Follow-up in 6 months for physical, sooner if needed for any new or worsening knee pain.  If you have lab work done today you will be contacted with your lab results within the next 2 weeks.  If you have not heard from [place] then please contact us. The fastest way to get your results is to register for My Chart.   IF you received an x-ray today, you will receive an invoice from [facility]. Please contact [facility] at [phone] with questions or concerns regarding your invoice.   IF you received labwork today, you will receive an invoice from [place]. Please contact [facility] at [phone] with questions or concerns regarding your invoice.   Our billing staff will not be able to assist you with questions regarding bills from these companies.  You will be contacted with the lab results as soon as they are available. The fastest way to get your results is to activate your My Chart account. Instructions are located on the last page of this paperwork. If you have not heard from [place] regarding the results in 2 weeks, please contact this office.

## 2019-08-24 NOTE — Progress Notes (Signed)
Subjective:    Patient ID: Hunter Adams, male    DOB: 11-22-1970, 49 y.o.   MRN: [id number]  HPI Hunter Adams is a 49 y.o. male Presents today for: Chief Complaint  Patient presents with  . Chronic Condition    6 m f/u   Hypertension: BP Readings from Last 3 Encounters:  08/24/19 120/76  05/13/19 139/77  03/20/19 132/74   Lab Results  Component Value Date   CREATININE 1.08 03/20/2019  Lisinopril 20 mg daily, amlodipine 5 mg daily. Home BP at [place] high off med (ran out temporarily). No other home readings.  No new side effects with meds.    Obesity, prediabetes Wt Readings from Last 3 Encounters:  08/24/19 236 lb 3.2 oz (107.1 kg)  05/13/19 238 lb 1.6 oz (108 kg)  03/20/19 240 lb (108.9 kg)   Body mass index is 35.39 kg/m.  Lab Results  Component Value Date   HGBA1C 6.1 (H) 03/20/2019  exercise: walking 4-5 miles 5 days per week. Some knee stiffness in am , improves during the day.  Fast food: 1x/week.  Soda: 3 per week. No sweet tea. Beer - 1-2 per week.  Smoothie in am.  Hyperlipidemia:  Lab Results  Component Value Date   CHOL 119 03/20/2019   HDL 33 (L) 03/20/2019   LDLCALC 71 03/20/2019   TRIG 73 03/20/2019   CHOLHDL 3.6 03/20/2019   Lab Results  Component Value Date   ALT 35 03/20/2019   AST 25 03/20/2019   ALKPHOS 120 (H) 03/20/2019   BILITOT 0.6 03/20/2019  Lipitor 20 mg daily. No new myalgias/side effects.  taking daily.   Planning on scheduling colonoscopy with [facility] soon.    Patient Active Problem List   Diagnosis Date Noted  . BMI 34.0-34.9,adult 06/20/2018  . HTN (hypertension) 06/17/2014   Past Medical History:  Diagnosis Date  . Allergy   . Hypertension    No past surgical history on file. No Known Allergies Prior to Admission medications   Medication Sig Start Date End Date Taking? Authorizing Provider  amLODipine (NORVASC) 5 MG tablet Take 1 tablet (5 mg total) by mouth daily. 03/20/19  Yes Wendie Agreste, MD  atorvastatin (LIPITOR) 20 MG tablet Take 1 tablet (20 mg total) by mouth daily at 6 PM. 03/20/19  Yes Wendie Agreste, MD  lisinopril (ZESTRIL) 20 MG tablet Take 1 tablet (20 mg total) by mouth daily. 08/21/19  Yes Wendie Agreste, MD  Multiple Vitamin (MULTIVITAMIN WITH MINERALS) TABS tablet Take 1 tablet by mouth daily.   Yes [provider]  sildenafil (VIAGRA) 100 MG tablet Take 0.5-1 tablets (50-100 mg total) by mouth daily as needed for erectile dysfunction. Max dose in 24 hour period is 183m. 06/20/18  Yes WLeonie Douglas PA-C   Social History   Socioeconomic History  . Marital status: Single    Spouse name: Not on file  . Number of children: 3  . Years of education: Not on file  . Highest education level: Not on file  Occupational History  . Not on file  Social Needs  . Financial resource strain: Not on file  . Food insecurity    Worry: Not on file    Inability: Not on file  . Transportation needs    Medical: Not on file    Non-medical: Not on file  Tobacco Use  . Smoking status: Never Smoker  . Smokeless tobacco: Never Used  Substance and Sexual Activity  .  Alcohol use: Yes    Alcohol/week: 0.0 standard drinks    Comment: occasionally   . Drug use: No  . Sexual activity: Yes    Partners: Female    Comment: with multiple partners, does not use condoms always  Lifestyle  . Physical activity    Days per week: 3 days    Minutes per session: 30 min  . Stress: Not at all  Relationships  . Social Herbalist on phone: Twice a week    Gets together: Once a week    Attends religious service: More than 4 times per year    Active member of club or organization: Yes    Attends meetings of clubs or organizations: More than 4 times per year    Relationship status: Never married  . Intimate partner violence    Fear of current or ex partner: No    Emotionally abused: No    Physically abused: No    Forced sexual activity: No  Other  Topics Concern  . Not on file  Social History Narrative   Pt is from [place], [place]. Has lived in [place] since 1998. Has three children. Lives at home alone. Works at [employer] at [employer] and [employer].     Review of Systems  Constitutional: Negative for fatigue and unexpected weight change.  Eyes: Negative for visual disturbance.  Respiratory: Negative for cough, chest tightness and shortness of breath.   Cardiovascular: Negative for chest pain, palpitations and leg swelling.  Gastrointestinal: Negative for abdominal pain and blood in stool.  Neurological: Negative for dizziness, light-headedness and headaches.       Objective:   Physical Exam Vitals signs reviewed.  Constitutional:      Appearance: He is well-developed.  HENT:     Head: Normocephalic and atraumatic.  Eyes:     Pupils: Pupils are equal, round, and reactive to light.  Neck:     Vascular: No carotid bruit or JVD.  Cardiovascular:     Rate and Rhythm: Normal rate and regular rhythm.     Heart sounds: Normal heart sounds. No murmur.  Pulmonary:     Effort: Pulmonary effort is normal.     Breath sounds: Normal breath sounds. No rales.  Skin:    General: Skin is warm and dry.  Neurological:     Mental Status: He is alert and oriented to person, place, and time.    Vitals:   08/24/19 0948  BP: 120/76  Pulse: 64  Temp: 98.5 F (36.9 C)  TempSrc: Oral  SpO2: 97%  Weight: 236 lb 3.2 oz (107.1 kg)  Height: 5' 8.5" (1.74 m)       Assessment & Plan:   Hunter Adams is a 49 y.o. male Essential hypertension - Plan: CMP14+EGFR, lisinopril (ZESTRIL) 20 MG tablet, amLODipine (NORVASC) 5 MG tablet  -  Stable, tolerating current regimen. Medications refilled. Labs pending as above.   Hyperlipidemia, unspecified hyperlipidemia type - Plan: Lipid panel, CMP14+EGFR, atorvastatin (LIPITOR) 20 MG tablet  -  Stable, tolerating current regimen. Medications refilled. Labs pending as above.   Prediabetes  - Plan: Hemoglobin A1c.  - portion sizes discussed, and cautioned on amount of fruit/sugars in smoothies. Commended on continued exercise. Check A1c.   Meds ordered this encounter  Medications  . lisinopril (ZESTRIL) 20 MG tablet    Sig: Take 1 tablet (20 mg total) by mouth daily.    Dispense:  90 tablet    Refill:  1  .  atorvastatin (LIPITOR) 20 MG tablet    Sig: Take 1 tablet (20 mg total) by mouth daily at 6 PM.    Dispense:  90 tablet    Refill:  1  . amLODipine (NORVASC) 5 MG tablet    Sig: Take 1 tablet (5 mg total) by mouth daily.    Dispense:  90 tablet    Refill:  1   Patient Instructions     Keep up the good work with exercise and watching diet.  Breakfast every day is a great start and can help metabolism as well.  Watch portion sizes for the other meals.    I will let you know the results of your lab work within the next 2 weeks.  Follow-up in 6 months for physical, sooner if needed for any new or worsening knee pain.  If you have lab work done today you will be contacted with your lab results within the next 2 weeks.  If you have not heard from [place] then please contact us. The fastest way to get your results is to register for My Chart.   IF you received an x-ray today, you will receive an invoice from [facility]. Please contact [facility] at [phone] with questions or concerns regarding your invoice.   IF you received labwork today, you will receive an invoice from [place]. Please contact [facility] at [phone] with questions or concerns regarding your invoice.   Our billing staff will not be able to assist you with questions regarding bills from these companies.  You will be contacted with the lab results as soon as they are available. The fastest way to get your results is to activate your My Chart account. Instructions are located on the last page of this paperwork. If you have not heard from [place] regarding the results in 2 weeks, please  contact this office.       Signed,   Merri Ray, MD [facility] at [place].  08/24/19 10:35 AM

## 2019-08-25 LAB — LIPID PANEL
Chol/HDL Ratio: 3.9 ratio (ref 0.0–5.0)
Cholesterol, Total: 138 mg/dL (ref 100–199)
HDL: 35 mg/dL — ABNORMAL LOW (ref >39)
LDL Calculated: 88 mg/dL (ref 0–99)
Triglycerides: 73 mg/dL (ref 0–149)
VLDL Cholesterol Cal: 15 mg/dL (ref 5–40)

## 2019-08-25 LAB — HEMOGLOBIN A1C
Est. average glucose Bld gHb Est-mCnc: 128 mg/dL
Hgb A1c MFr Bld: 6.1 % — ABNORMAL HIGH (ref 4.8–5.6)

## 2019-08-25 LAB — CMP14+EGFR
ALT: 21 IU/L (ref 0–44)
AST: 20 IU/L (ref 0–40)
Albumin/Globulin Ratio: 1.6 (ref 1.2–2.2)
Albumin: 4.7 g/dL (ref 4.0–5.0)
Alkaline Phosphatase: 118 IU/L — ABNORMAL HIGH (ref 39–117)
BUN/Creatinine Ratio: 13 (ref 9–20)
BUN: 15 mg/dL (ref 6–24)
Bilirubin Total: 0.8 mg/dL (ref 0.0–1.2)
CO2: 26 mmol/L (ref 20–29)
Calcium: 10 mg/dL (ref 8.7–10.2)
Chloride: 102 mmol/L (ref 96–106)
Creatinine, Ser: 1.19 mg/dL (ref 0.76–1.27)
GFR calc Af Amer: 82 mL/min/{1.73_m2} (ref >59)
GFR calc non Af Amer: 71 mL/min/{1.73_m2} (ref >59)
Globulin, Total: 2.9 g/dL (ref 1.5–4.5)
Glucose: 97 mg/dL (ref 65–99)
Potassium: 4.6 mmol/L (ref 3.5–5.2)
Sodium: 142 mmol/L (ref 134–144)
Total Protein: 7.6 g/dL (ref 6.0–8.5)

## 2019-09-22 ENCOUNTER — Ambulatory Visit: Payer: BC Managed Care – PPO | Admitting: Emergency Medicine

## 2019-10-14 ENCOUNTER — Ambulatory Visit: Payer: BC Managed Care – PPO

## 2019-10-14 ENCOUNTER — Other Ambulatory Visit: Payer: Self-pay

## 2019-10-14 ENCOUNTER — Ambulatory Visit (INDEPENDENT_AMBULATORY_CARE_PROVIDER_SITE_OTHER): Payer: BC Managed Care – PPO | Admitting: Family Medicine

## 2019-10-14 ENCOUNTER — Ambulatory Visit (AMBULATORY_SURGERY_CENTER): Payer: BC Managed Care – PPO | Admitting: *Deleted

## 2019-10-14 VITALS — Temp 96.2°F | Ht 68.0 in | Wt 241.0 lb

## 2019-10-14 DIAGNOSIS — Z23 Encounter for immunization: Secondary | ICD-10-CM

## 2019-10-14 DIAGNOSIS — Z1159 Encounter for screening for other viral diseases: Secondary | ICD-10-CM

## 2019-10-14 DIAGNOSIS — Z1211 Encounter for screening for malignant neoplasm of colon: Secondary | ICD-10-CM

## 2019-10-14 MED ORDER — NA SULFATE-K SULFATE-MG SULF 17.5-3.13-1.6 GM/177ML PO SOLN: ORAL | 0 refills | Status: DC

## 2019-10-14 NOTE — Progress Notes (Signed)
Patient is here in-person for PV. Patient denies any allergies to eggs or soy. Patient denies any past surgeries . Patient denies any oxygen use at home. Patient denies taking any diet/weight loss medications or blood thinners. Patient is not being treated for MRSA or C-diff. EMMI education assisgned to patient on colonoscopy, this was explained and instructions given to patient.  Pt is aware of Covid screening on 10/23/2019 at 10 am.     Pt is aware that care partner will wait in the car during procedure; if they feel like they will be too hot to wait in the car; they may wait in the lobby. Patient is aware to bring only one care partner. We want them to wear a mask (we do not have any that we can provide them), practice social distancing, and we will check their temperatures when they get here.  I did remind patient that their care partner needs to stay in the parking lot the entire time. Pt will wear mask into building.

## 2019-10-14 NOTE — Progress Notes (Signed)
NURSE VISIT ONLY

## 2019-10-15 ENCOUNTER — Ambulatory Visit: Payer: BC Managed Care – PPO

## 2019-10-16 ENCOUNTER — Encounter: Payer: Self-pay | Admitting: Gastroenterology

## 2019-10-16 ENCOUNTER — Ambulatory Visit: Payer: BC Managed Care – PPO

## 2019-10-23 ENCOUNTER — Telehealth: Payer: Self-pay | Admitting: *Deleted

## 2019-10-23 DIAGNOSIS — Z1159 Encounter for screening for other viral diseases: Secondary | ICD-10-CM

## 2019-10-23 DIAGNOSIS — U071 COVID-19: Secondary | ICD-10-CM

## 2019-10-23 HISTORY — DX: COVID-19: U07.1

## 2019-10-23 NOTE — Telephone Encounter (Signed)
covid test order put in again per [facility] request

## 2019-10-24 LAB — SARS CORONAVIRUS 2 (TAT 6-24 HRS): SARS Coronavirus 2: POSITIVE — AB

## 2019-10-28 ENCOUNTER — Other Ambulatory Visit: Payer: Self-pay

## 2019-10-28 ENCOUNTER — Telehealth: Payer: Self-pay | Admitting: Gastroenterology

## 2019-10-28 ENCOUNTER — Encounter: Payer: BC Managed Care – PPO | Admitting: Gastroenterology

## 2019-10-28 NOTE — Telephone Encounter (Signed)
Please write a letter for the patient to his employer to remain out of work for at least 10 days after testing as long as he is symptoms free.

## 2019-10-28 NOTE — Telephone Encounter (Signed)
Positive COVID19 test that was done for [facility] procedure. Patient did call his PCP as he was instructed. He was told by the PCP that because [facility] ordered the test, we are responsible for any out of work note and proof of test result required by the patient's HR.

## 2019-10-28 NOTE — Telephone Encounter (Signed)
Patient is calling because he tested postive for his COVID test that he got for his procedure- he is calling asking for a note to send to his work that shows he tested postive and that he needs to be out of work. Asked to get it emailed because he does not have fax. [email or web address]

## 2019-10-28 NOTE — Telephone Encounter (Signed)
Called patient. No answer. Got his voicemail. Left a message. I do not have a way to email anything from his chart. He can access his results through the patient portal. I do not know if he can send anything from the My Chart app. The results were faxed to his PCP on Monday.

## 2019-10-29 NOTE — Telephone Encounter (Signed)
Letter written.  Patient needs to be contacted after 1pm for a fax number. Will send the letter and test results to that fax number.

## 2019-10-30 NOTE — Telephone Encounter (Signed)
Letter faxed as requested by patient. Weston Brass [phone]

## 2019-12-04 ENCOUNTER — Encounter: Payer: Self-pay | Admitting: Gastroenterology

## 2020-01-11 ENCOUNTER — Ambulatory Visit: Payer: BC Managed Care – PPO

## 2020-01-11 ENCOUNTER — Other Ambulatory Visit: Payer: Self-pay

## 2020-01-11 VITALS — Temp 96.8°F | Ht 68.0 in | Wt 236.0 lb

## 2020-01-11 DIAGNOSIS — Z1211 Encounter for screening for malignant neoplasm of colon: Secondary | ICD-10-CM

## 2020-01-11 DIAGNOSIS — Z01818 Encounter for other preprocedural examination: Secondary | ICD-10-CM

## 2020-01-11 NOTE — Progress Notes (Signed)
No egg or soy allergy known to patient  No issues with past sedation with any surgeries  or procedures, no intubation problems  No diet pills per patient No home 02 use per patient  No blood thinners per patient  Pt denies issues with constipation  No A fib or A flutter  EMMI video sent to pt's e mail  Pt has suprep at home from October. Due to the COVID-19 pandemic we are asking patients to follow these guidelines. Please only bring one care partner. Please be aware that your care partner may wait in the car in the parking lot or if they feel like they will be too hot to wait in the car, they may wait in the lobby on the 4th floor. All care partners are required to wear a mask the entire time (we do not have any that we can provide them), they need to practice social distancing, and we will do a Covid check for all patient's and care partners when you arrive. Also we will check their temperature and your temperature. If the care partner waits in their car they need to stay in the parking lot the entire time and we will call them on their cell phone when the patient is ready for discharge so they can bring the car to the front of the building. Also all patient's will need to wear a mask into building.

## 2020-01-12 ENCOUNTER — Encounter: Payer: Self-pay | Admitting: Gastroenterology

## 2020-01-19 ENCOUNTER — Ambulatory Visit (INDEPENDENT_AMBULATORY_CARE_PROVIDER_SITE_OTHER): Payer: BC Managed Care – PPO

## 2020-01-19 ENCOUNTER — Other Ambulatory Visit: Payer: Self-pay | Admitting: Gastroenterology

## 2020-01-19 DIAGNOSIS — Z1159 Encounter for screening for other viral diseases: Secondary | ICD-10-CM

## 2020-01-19 LAB — SARS CORONAVIRUS 2 (TAT 6-24 HRS): SARS Coronavirus 2: NEGATIVE

## 2020-01-22 ENCOUNTER — Other Ambulatory Visit: Payer: Self-pay

## 2020-01-22 ENCOUNTER — Encounter: Payer: Self-pay | Admitting: Gastroenterology

## 2020-01-22 ENCOUNTER — Ambulatory Visit (AMBULATORY_SURGERY_CENTER): Payer: BC Managed Care – PPO | Admitting: Gastroenterology

## 2020-01-22 VITALS — BP 111/70 | HR 68 | Temp 98.6°F | Resp 11 | Ht 68.0 in | Wt 236.0 lb

## 2020-01-22 DIAGNOSIS — Z1211 Encounter for screening for malignant neoplasm of colon: Secondary | ICD-10-CM

## 2020-01-22 MED ORDER — SODIUM CHLORIDE 0.9 % IV SOLN: 500.0000 mL | Freq: Once | INTRAVENOUS | Status: DC

## 2020-01-22 NOTE — Progress Notes (Signed)
Pt's states no medical or surgical changes since previsit or office visit.  Temp- June Vitals- Donna 

## 2020-01-22 NOTE — Op Note (Signed)
Hunter Adams Patient Name: Hunter Adams Procedure Date: 01/22/2020 9:28 AM MRN: [id number] Endoscopist: Mauri Pole , MD Age: 50 Referring MD:  Date of Birth: 1970-01-22 Gender: Male Account #: [id number] Procedure:                Colonoscopy Indications:              Screening for colorectal malignant neoplasm Medicines:                Monitored Anesthesia Care Procedure:                Pre-Anesthesia Assessment:                           - Prior to the procedure, a History and Physical                            was performed, and patient medications and                            allergies were reviewed. The patient's tolerance of                            previous anesthesia was also reviewed. The risks                            and benefits of the procedure and the sedation                            options and risks were discussed with the patient.                            All questions were answered, and informed consent                            was obtained. Prior Anticoagulants: The patient has                            taken no previous anticoagulant or antiplatelet                            agents. ASA Grade Assessment: II - A patient with                            mild systemic disease. After reviewing the risks                            and benefits, the patient was deemed in                            satisfactory condition to undergo the procedure.                           After obtaining informed consent, the colonoscope  was passed under direct vision. Throughout the                            procedure, the patient's blood pressure, pulse, and                            oxygen saturations were monitored continuously. The                            Colonoscope was introduced through the anus and                            advanced to the the cecum, identified by                            appendiceal orifice  and ileocecal valve. The                            colonoscopy was performed without difficulty. The                            patient tolerated the procedure well. The quality                            of the bowel preparation was excellent. The                            ileocecal valve, appendiceal orifice, and rectum                            were photographed. Scope In: 9:34:04 AM Scope Out: 9:49:38 AM Scope Withdrawal Time: 0 hours 13 minutes 7 seconds  Total Procedure Duration: 0 hours 15 minutes 34 seconds  Findings:                 The perianal and digital rectal examinations were                            normal.                           The entire examined colon appeared normal. Complications:            No immediate complications. Estimated Blood Loss:     Estimated blood loss was minimal. Impression:               - The entire examined colon is normal.                           - No specimens collected. Recommendation:           - Patient has a contact number available for                            emergencies. The signs and symptoms of potential  delayed complications were discussed with the                            patient. Return to normal activities tomorrow.                            Written discharge instructions were provided to the                            patient.                           - Resume previous diet.                           - Continue present medications.                           - Repeat colonoscopy in 10 years for screening                            purposes. Napoleon Form, MD 01/22/2020 9:53:46 AM This report has been signed electronically.

## 2020-01-22 NOTE — Patient Instructions (Signed)
Thank you for letting us take care of your healthcare needs today.     YOU HAD AN ENDOSCOPIC PROCEDURE TODAY AT THE [facility]:   Refer to the procedure report that was given to you for any specific questions about what was found during the examination.  If the procedure report does not answer your questions, please call your gastroenterologist to clarify.  If you requested that your care partner not be given the details of your procedure findings, then the procedure report has been included in a sealed envelope for you to review at your convenience later.  YOU SHOULD EXPECT: Some feelings of bloating in the abdomen. Passage of more gas than usual.  Walking can help get rid of the air that was put into your GI tract during the procedure and reduce the bloating. If you had a lower endoscopy (such as a colonoscopy or flexible sigmoidoscopy) you may notice spotting of blood in your stool or on the toilet paper. If you underwent a bowel prep for your procedure, you may not have a normal bowel movement for a few days.  Please Note:  You might notice some irritation and congestion in your nose or some drainage.  This is from the oxygen used during your procedure.  There is no need for concern and it should clear up in a day or so.  SYMPTOMS TO REPORT IMMEDIATELY:   Following lower endoscopy (colonoscopy or flexible sigmoidoscopy):  Excessive amounts of blood in the stool  Significant tenderness or worsening of abdominal pains  Swelling of the abdomen that is new, acute  Fever of 100F or higher   For urgent or emergent issues, a gastroenterologist can be reached at any hour by calling [phone].   DIET:  We do recommend a small meal at first, but then you may proceed to your regular diet.  Drink plenty of fluids but you should avoid alcoholic beverages for 24 hours.  ACTIVITY:  You should plan to take it easy for the rest of today and you should NOT DRIVE or use heavy machinery  until tomorrow (because of the sedation medicines used during the test).    FOLLOW UP: Our staff will call the number listed on your records 48-72 hours following your procedure to check on you and address any questions or concerns that you may have regarding the information given to you following your procedure. If we do not reach you, we will leave a message.  We will attempt to reach you two times.  During this call, we will ask if you have developed any symptoms of COVID 19. If you develop any symptoms (ie: fever, flu-like symptoms, shortness of breath, cough etc.) before then, please call [phone].  If you test positive for Covid 19 in the 2 weeks post procedure, please call and report this information to us.    If any biopsies were taken you will be contacted by phone or by letter within the next 1-3 weeks.  Please call us at [phone] if you have not heard about the biopsies in 3 weeks.    SIGNATURES/CONFIDENTIALITY: You and/or your care partner have signed paperwork which will be entered into your electronic medical record.  These signatures attest to the fact that that the information above on your After Visit Summary has been reviewed and is understood.  Full responsibility of the confidentiality of this discharge information lies with you and/or your care-partner. 

## 2020-01-22 NOTE — Progress Notes (Signed)
PT taken to PACU. Monitors in place. VSS. Report given to RN. 

## 2020-01-26 ENCOUNTER — Telehealth: Payer: Self-pay

## 2020-01-26 NOTE — Telephone Encounter (Signed)
  Follow up Call-  Call back number 01/22/2020  Post procedure Call Back phone  # [phone]  Permission to leave phone message Yes  Some recent data might be hidden     Patient questions:  Do you have a fever, pain , or abdominal swelling? No. Pain Score  0 *  Have you tolerated food without any problems? Yes.    Have you been able to return to your normal activities? Yes.    Do you have any questions about your discharge instructions: Diet   No. Medications  No. Follow up visit  No.  Do you have questions or concerns about your Care? No.  Actions: * If pain score is 4 or above: No action needed, pain <4.   1. Have you developed a fever since your procedure? No  2.   Have you had an respiratory symptoms (SOB or cough) since your procedure? No  3.   Have you tested positive for COVID 19 since your procedure? No  4.   Have you had any family members/close contacts diagnosed with the COVID 19 since your procedure?  No   If yes to any of these questions please route to Laverna Peace, RN and Jennye Boroughs, RN.

## 2020-01-26 NOTE — Telephone Encounter (Signed)
  Follow up Call-  Call back number 01/22/2020  Post procedure Call Back phone  # [phone]  Permission to leave phone message Yes  Some recent data might be hidden     Left message

## 2020-03-10 ENCOUNTER — Ambulatory Visit: Payer: BC Managed Care – PPO | Attending: Family

## 2020-03-10 DIAGNOSIS — Z23 Encounter for immunization: Secondary | ICD-10-CM

## 2020-03-10 NOTE — Progress Notes (Signed)
   Covid-19 Vaccination Clinic  Name:  Hunter Adams    MRN: [id number] DOB: 22-Mar-1970  03/10/2020  [name] was observed post Covid-19 immunization for 15 minutes without incident. He was provided with Vaccine Information Sheet and instruction to access the V-Safe system.   [name] was instructed to call 911 with any severe reactions post vaccine: Marland Kitchen Difficulty breathing  . Swelling of face and throat  . A fast heartbeat  . A bad rash all over body  . Dizziness and weakness   Immunizations Administered    Name Date Dose VIS Date Route   Moderna COVID-19 Vaccine 03/10/2020  2:24 PM 0.5 mL 12/01/2019 Intramuscular   Manufacturer: Moderna   Lot: [id number]   NDC: 76734-193-79

## 2020-03-23 ENCOUNTER — Other Ambulatory Visit: Payer: Self-pay | Admitting: Family Medicine

## 2020-03-23 DIAGNOSIS — I10 Essential (primary) hypertension: Secondary | ICD-10-CM

## 2020-03-23 NOTE — Telephone Encounter (Signed)
Requested Prescriptions  Pending Prescriptions Disp Refills  . amLODipine (NORVASC) 5 MG tablet [Pharmacy Med Name: amLODIPine Besylate 5 MG Oral Tablet] 30 tablet 0    Sig: Take 1 tablet by mouth once daily     Cardiovascular:  Calcium Channel Blockers Failed - 03/23/2020  5:56 AM      Failed - Valid encounter within last 6 months    Recent Outpatient Visits          5 months ago Need for influenza vaccination   [facility], Asencion Partridge, MD   7 months ago Essential hypertension   [facility], Asencion Partridge, MD   1 year ago Annual physical exam   [facility], Asencion Partridge, MD   1 year ago Essential hypertension   [facility], [place] D, PA-C   2 years ago Essential hypertension   [facility], Gerald Stabs, PA-C      Future Appointments            In 2 weeks Shade Flood, MD [facility], [facility]           Passed - Last BP in normal range    BP Readings from Last 1 Encounters:  01/22/20 111/70

## 2020-03-25 ENCOUNTER — Encounter: Payer: BC Managed Care – PPO | Admitting: Family Medicine

## 2020-04-08 ENCOUNTER — Ambulatory Visit (INDEPENDENT_AMBULATORY_CARE_PROVIDER_SITE_OTHER): Payer: BC Managed Care – PPO | Admitting: Family Medicine

## 2020-04-08 ENCOUNTER — Other Ambulatory Visit: Payer: Self-pay

## 2020-04-08 ENCOUNTER — Other Ambulatory Visit (HOSPITAL_COMMUNITY)
Admission: RE | Admit: 2020-04-08 | Discharge: 2020-04-08 | Disposition: A | Discharge status: Home / Self Care | Payer: BC Managed Care – PPO | Source: Ambulatory Visit | Attending: Family Medicine | Admitting: Family Medicine

## 2020-04-08 ENCOUNTER — Encounter: Payer: Self-pay | Admitting: Family Medicine

## 2020-04-08 VITALS — BP 131/81 | HR 63 | Temp 95.0°F | Ht 68.5 in | Wt 230.0 lb

## 2020-04-08 DIAGNOSIS — Z0000 Encounter for general adult medical examination without abnormal findings: Secondary | ICD-10-CM

## 2020-04-08 DIAGNOSIS — Z6834 Body mass index (BMI) 34.0-34.9, adult: Secondary | ICD-10-CM

## 2020-04-08 DIAGNOSIS — Z0001 Encounter for general adult medical examination with abnormal findings: Secondary | ICD-10-CM

## 2020-04-08 DIAGNOSIS — Z113 Encounter for screening for infections with a predominantly sexual mode of transmission: Secondary | ICD-10-CM

## 2020-04-08 DIAGNOSIS — E785 Hyperlipidemia, unspecified: Secondary | ICD-10-CM | POA: Diagnosis not present

## 2020-04-08 DIAGNOSIS — I10 Essential (primary) hypertension: Secondary | ICD-10-CM

## 2020-04-08 DIAGNOSIS — E669 Obesity, unspecified: Secondary | ICD-10-CM

## 2020-04-08 DIAGNOSIS — Z125 Encounter for screening for malignant neoplasm of prostate: Secondary | ICD-10-CM

## 2020-04-08 DIAGNOSIS — R7303 Prediabetes: Secondary | ICD-10-CM | POA: Diagnosis not present

## 2020-04-08 MED ORDER — LISINOPRIL 20 MG PO TABS: 20.0000 mg | ORAL_TABLET | Freq: Every day | ORAL | 1 refills | Status: DC

## 2020-04-08 MED ORDER — AMLODIPINE BESYLATE 5 MG PO TABS: 5.0000 mg | ORAL_TABLET | Freq: Every day | ORAL | 1 refills | Status: DC

## 2020-04-08 MED ORDER — ATORVASTATIN CALCIUM 20 MG PO TABS: 20.0000 mg | ORAL_TABLET | Freq: Every day | ORAL | 1 refills | Status: DC

## 2020-04-08 NOTE — Progress Notes (Addendum)
Subjective:  Patient ID: Hunter Adams, male    DOB: [birth year]  Age: 50 y.o. MRN: [id number]  CC:  Chief Complaint  Patient presents with  . Annual Exam    pt states he feels good with no complants. pt may have some concers of arthritis in his knees. pt some times has a hard time squating down to grab something from the ground. pt dosn't check his BP at home, but reports no issues with his hypertension.    HPI Hunter Adams presents for  Annual physical exam.  Hypertension: Amlodipine 5 mg daily, lisinopril 20 mg daily. No new side effects.  Home readings:none. BP Readings from Last 3 Encounters:  04/08/20 131/81  01/22/20 111/70  08/24/19 120/76   Lab Results  Component Value Date   CREATININE 1.19 08/24/2019    Hyperlipidemia: Lipitor 20 mg daily. No new myalgias/side effects.  Lab Results  Component Value Date   CHOL 138 08/24/2019   HDL 35 (L) 08/24/2019   LDLCALC 88 08/24/2019   TRIG 73 08/24/2019   CHOLHDL 3.9 08/24/2019   Lab Results  Component Value Date   ALT 21 08/24/2019   AST 20 08/24/2019   ALKPHOS 118 (H) 08/24/2019   BILITOT 0.8 08/24/2019   Prediabetes/obesity Weight has decreased 6 pounds since his visit with me in August of last year. Cutting down on sugar and snacks. Avoiding soda.  Wt Readings from Last 3 Encounters:  04/08/20 230 lb (104.3 kg)  01/22/20 236 lb (107 kg)  01/11/20 236 lb (107 kg)    Lab Results  Component Value Date   HGBA1C 6.1 (H) 08/24/2019   Episodic knee pain Bilateral.  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 Has tried topical arthritis cream at times or ace bandage helps.  No locking/swelling/giving way.  NKI.   STI screening Unprotected intercourse with new partner earlier this week. No symptoms. Requests testing.   Cancer screening Colonoscopy 01/22/2020 Prostate: agrees to testing with PSA only after R/B of testing discussed.  Lab Results  Component Value Date   PSA1 3.0  03/20/2019    Immunization History  Administered Date(s) Administered  . Influenza,inj,Quad PF,6+ Mos 02/28/2016, 10/14/2019  . Moderna SARS-COVID-2 Vaccination 03/10/2020  . Tdap 06/17/2014  had covid vaccine #1.    Depression screen [facility] 2/9 04/08/2020 08/24/2019 03/20/2019 06/20/2018 03/24/2018  Decreased Interest 0 0 0 0 0  Down, Depressed, Hopeless 0 0 0 0 0  PHQ - 2 Score 0 0 0 0 0  Altered sleeping - - 0 - -  Tired, decreased energy - - 0 - -  Change in appetite - - 0 - -  Feeling bad or failure about yourself  - - 0 - -  Trouble concentrating - - 0 - -  Moving slowly or fidgety/restless - - 0 - -  Suicidal thoughts - - 0 - -  PHQ-9 Score - - 0 - -  Difficult doing work/chores - - Not difficult at all - -    Hearing Screening   125Hz 250Hz 500Hz 1000Hz 2000Hz 3000Hz 4000Hz 6000Hz 8000Hz  Right ear:           Left ear:             Visual Acuity Screening   Right eye Left eye Both eyes  Without correction:     With correction: 20/20 20/20 20/15-1   optho 2 months ago.   Dental Every 6 months. appt today for crown.   Exercise 5  days per week. 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      History Patient Active Problem List   Diagnosis Date Noted  . BMI 34.0-34.9,adult 06/20/2018  . HTN (hypertension) 06/17/2014   Past Medical History:  Diagnosis Date  . Allergy    seasonal  . COVID-19 42/68/3419   no complications, no hospitalization  . Hyperlipidemia   . Hypertension    Past Surgical History:  Procedure Laterality Date  . NO PAST SURGERIES     No Known Allergies Prior to Admission medications   Medication Sig Start Date End Date Taking? Authorizing Provider  amLODipine (NORVASC) 5 MG tablet Take 1 tablet by mouth once daily 03/23/20   Wendie Agreste, MD  atorvastatin (LIPITOR) 20 MG tablet Take 1 tablet (20 mg total) by mouth daily at 6 PM. 08/24/19   Wendie Agreste, MD  B Complex-C (SUPER B COMPLEX/VITAMIN C) TABS Take 1 tablet by mouth. With  zinc    [provider]  lisinopril (ZESTRIL) 20 MG tablet Take 1 tablet (20 mg total) by mouth daily. 08/24/19   Wendie Agreste, MD  Multiple Vitamin (MULTIVITAMIN WITH MINERALS) TABS tablet Take 1 tablet by mouth daily.    [provider]  sildenafil (VIAGRA) 100 MG tablet Take 0.5-1 tablets (50-100 mg total) by mouth daily as needed for erectile dysfunction. Max dose in 24 hour period is 156m. Patient not taking: Reported on 01/22/2020 06/20/18   WLeonie Douglas PA-C   Social History   Socioeconomic History  . Marital status: Single    Spouse name: Not on file  . Number of children: 3  . Years of education: Not on file  . Highest education level: Not on file  Occupational History  . Not on file  Tobacco Use  . Smoking status: Never Smoker  . Smokeless tobacco: Never Used  Substance and Sexual Activity  . Alcohol use: Yes    Alcohol/week: 2.0 standard drinks    Types: 1 Glasses of wine, 1 Cans of beer per week  . Drug use: No  . Sexual activity: Yes    Partners: Female    Comment: with multiple partners, does not use condoms always  Other Topics Concern  . Not on file  Social History Narrative   [place] three children. Lives at home alone. Works at [employer]    Social Determinants of Health   Financial Resource Strain:   . Difficulty of Paying Living Expenses:   Food Insecurity:   . Worried About RCharity fundraiserin the Last Year:   . RArboriculturistin the Last Year:   Transportation Needs:   . LFilm/video editor(Medical):   .Marland KitchenLack of Transportation (Non-Medical):   Physical Activity:   . Days of Exercise per Week:   . Minutes of Exercise per Session:   Stress:   . Feeling of Stress :   Social Connections:   . Frequency of Communication with Friends and Family:   . Frequency of Social Gatherings with Friends and Family:   . Attends Religious  Services:   . Active Member of Clubs or Organizations:   . Attends CArchivistMeetings:   .Marland KitchenMarital Status:   Intimate Partner Violence:   . Fear of Current or Ex-Partner:   . Emotionally Abused:   .Marland KitchenPhysically Abused:   . Sexually Abused:  Review of Systems 13 point review of systems per patient health survey noted.  Negative other than as indicated above or in HPI.    Objective:   Vitals:   04/08/20 0817  BP: 131/81  Pulse: 63  Temp: (!) 95 F (35 C)  TempSrc: Temporal  SpO2: 95%  Weight: 230 lb (104.3 kg)  Height: 5' 8.5" (1.74 m)     Physical Exam Vitals reviewed.  Constitutional:      Appearance: He is well-developed.  HENT:     Head: Normocephalic and atraumatic.     Right Ear: External ear normal.     Left Ear: External ear normal.  Eyes:     Conjunctiva/sclera: Conjunctivae normal.     Pupils: Pupils are equal, round, and reactive to light.  Neck:     Thyroid: No thyromegaly.  Cardiovascular:     Rate and Rhythm: Normal rate and regular rhythm.     Heart sounds: Normal heart sounds.  Pulmonary:     Effort: Pulmonary effort is normal. No respiratory distress.     Breath sounds: Normal breath sounds. No wheezing.  Abdominal:     General: There is no distension.     Palpations: Abdomen is soft.     Tenderness: There is no abdominal tenderness.  Musculoskeletal:        General: No tenderness. Normal range of motion.     Cervical back: Normal range of motion and neck supple.     Comments: Bilateral knees Pain-free range of motion, full range of motion, skin intact, no effusion, negative Lachman, McMurray, varus, valgus stressing.  Lymphadenopathy:     Cervical: No cervical adenopathy.  Skin:    General: Skin is warm and dry.  Neurological:     Mental Status: He is alert and oriented to person, place, and time.     Deep Tendon Reflexes: Reflexes are normal and symmetric.  Psychiatric:        Behavior: Behavior normal.         Assessment & Plan:  BERLIN MOKRY is a 50 y.o. male . Annual physical exam  - -anticipatory guidance as below in AVS, screening labs above. Health maintenance items as above in HPI discussed/recommended as applicable.   Hyperlipidemia, unspecified hyperlipidemia type - Plan: Lipid panel, atorvastatin (LIPITOR) 20 MG tablet  -  Stable, tolerating current regimen. Medications refilled. Labs pending as above.   Essential hypertension - Plan: CMP14+EGFR, amLODipine (NORVASC) 5 MG tablet, lisinopril (ZESTRIL) 20 MG tablet  -  Stable, tolerating current regimen. Medications refilled. Labs pending as above.   Prediabetes - Plan: Hemoglobin A1c Class 1 obesity without serious comorbidity with body mass index (BMI) of 34.0 to 34.9 in adult, unspecified obesity type  -Recheck A1c, commended on weight loss.  Commended on dietary changes.  Screening for prostate cancer We discussed pros and cons of prostate cancer screening, and after this discussion, he chose to have screening done. PSA obtained only and limitations without DRE discussed.  Routine screening for STI (sexually transmitted infection) - Plan: HIV Antibody (routine testing w rflx), RPR, GC/Chlamydia probe amp ([place])not at [facility]  -Asymptomatic, testing as above, repeat testing in 6 weeks as option.  Meds ordered this encounter  Medications  . amLODipine (NORVASC) 5 MG tablet    Sig: Take 1 tablet (5 mg total) by mouth daily.    Dispense:  90 tablet    Refill:  1  . lisinopril (ZESTRIL) 20 MG tablet    Sig: Take 1  tablet (20 mg total) by mouth daily.    Dispense:  90 tablet    Refill:  1  . atorvastatin (LIPITOR) 20 MG tablet    Sig: Take 1 tablet (20 mg total) by mouth daily at 6 PM.    Dispense:  90 tablet    Refill:  1   Patient Instructions    Tylenol if needed for knee pain, glucosamine over the counter can be tried as well. If knee pain not improved in next 4-6 weeks, return for xrays. Return to  the clinic or go to the nearest emergency room if any of your symptoms worsen or new symptoms occur.  I will check some other labs, but no med changes for now. Thanks for coming in today.   Keeping you healthy  Get these tests  Blood pressure- Have your blood pressure checked once a year by your healthcare provider.  Normal blood pressure is 120/80  Weight- Have your body mass index (BMI) calculated to screen for obesity.  BMI is a measure of body fat based on height and weight. You can also calculate your own BMI at ViewBanking.[email or web address].  Cholesterol- Have your cholesterol checked every year.  Diabetes- Have your blood sugar checked regularly if you have high blood pressure, high cholesterol, have a family history of diabetes or if you are overweight.  Screening for Colon Cancer- Colonoscopy starting at age 59.  Screening may begin sooner depending on your family history and other health conditions. Follow up colonoscopy as directed by your Gastroenterologist.  Screening for Prostate Cancer- Both blood work (PSA) and a rectal exam help screen for Prostate Cancer.  Screening begins at age 64 with African-American men and at age 58 with Caucasian men.  Screening may begin sooner depending on your family history.  Take these medicines  Aspirin- One aspirin daily can help prevent Heart disease and Stroke.  Flu shot- Every fall.  Tetanus- Every 10 years.  Zostavax- Once after the age of 45 to prevent Shingles.  Pneumonia shot- Once after the age of 4; if you are younger than 72, ask your healthcare provider if you need a Pneumonia shot.  Take these steps  Don't smoke- If you do smoke, talk to your doctor about quitting.  For tips on how to quit, go to www.smokefree.gov or call 1-800-QUIT-NOW.  Be physically active- Exercise 5 days a week for at least 30 minutes.  If you are not already physically active start slow and gradually work up to 30 minutes of moderate physical activity.   Examples of moderate activity include walking briskly, mowing the yard, dancing, swimming, bicycling, etc.  Eat a healthy diet- Eat a variety of healthy food such as fruits, vegetables, low fat milk, low fat cheese, yogurt, lean meant, poultry, fish, beans, tofu, etc. For more information go to www.thenutritionsource.org  Drink alcohol in moderation- Limit alcohol intake to less than two drinks a day. Never drink and drive.  Dentist- Brush and floss twice daily; visit your dentist twice a year.  Depression- Your emotional health is as important as your physical health. If you're feeling down, or losing interest in things you would normally enjoy please talk to your healthcare provider.  Eye exam- Visit your eye doctor every year.  Safe sex- If you may be exposed to a sexually transmitted infection, use a condom.  Seat belts- Seat belts can save your life; always wear one.  Smoke/Carbon Monoxide detectors- These detectors need to be installed on the appropriate level of your  home.  Replace batteries at least once a year.  Skin cancer- When out in the sun, cover up and use sunscreen 15 SPF or higher.  Violence- If anyone is threatening you, please tell your healthcare provider.  Living Will/ Health care power of attorney- Speak with your healthcare provider and family.    If you have lab work done today you will be contacted with your lab results within the next 2 weeks.  If you have not heard from [place] then please contact us. The fastest way to get your results is to register for My Chart.   IF you received an x-ray today, you will receive an invoice from [facility]. Please contact [facility] at [phone] with questions or concerns regarding your invoice.   IF you received labwork today, you will receive an invoice from [facility]. Please contact [facility] at [phone] with questions or concerns regarding your invoice.   Our billing staff will not be able to  assist you with questions regarding bills from these companies.  You will be contacted with the lab results as soon as they are available. The fastest way to get your results is to activate your My Chart account. Instructions are located on the last page of this paperwork. If you have not heard from [place] regarding the results in 2 weeks, please contact this office.         Signed, Merri Ray, MD [facility]

## 2020-04-08 NOTE — Patient Instructions (Addendum)
Tylenol if needed for knee pain, glucosamine over the counter can be tried as well. If knee pain not improved in next 4-6 weeks, return for xrays. Return to the clinic or go to the nearest emergency room if any of your symptoms worsen or new symptoms occur.  I will check some other labs, but no med changes for now. Thanks for coming in today.   Keeping you healthy  Get these tests  Blood pressure- Have your blood pressure checked once a year by your healthcare provider.  Normal blood pressure is 120/80  Weight- Have your body mass index (BMI) calculated to screen for obesity.  BMI is a measure of body fat based on height and weight. You can also calculate your own BMI at [email or web address].  Cholesterol- Have your cholesterol checked every year.  Diabetes- Have your blood sugar checked regularly if you have high blood pressure, high cholesterol, have a family history of diabetes or if you are overweight.  Screening for Colon Cancer- Colonoscopy starting at age 49.  Screening may begin sooner depending on your family history and other health conditions. Follow up colonoscopy as directed by your Gastroenterologist.  Screening for Prostate Cancer- Both blood work (PSA) and a rectal exam help screen for Prostate Cancer.  Screening begins at age 13 with African-American men and at age 38 with Caucasian men.  Screening may begin sooner depending on your family history.  Take these medicines  Aspirin- One aspirin daily can help prevent Heart disease and Stroke.  Flu shot- Every fall.  Tetanus- Every 10 years.  Zostavax- Once after the age of 66 to prevent Shingles.  Pneumonia shot- Once after the age of 46; if you are younger than 67, ask your healthcare provider if you need a Pneumonia shot.  Take these steps  Don't smoke- If you do smoke, talk to your doctor about quitting.  For tips on how to quit, go to www.smokefree.gov or call 1-800-QUIT-NOW.  Be physically active- Exercise  5 days a week for at least 30 minutes.  If you are not already physically active start slow and gradually work up to 30 minutes of moderate physical activity.  Examples of moderate activity include walking briskly, mowing the yard, dancing, swimming, bicycling, etc.  Eat a healthy diet- Eat a variety of healthy food such as fruits, vegetables, low fat milk, low fat cheese, yogurt, lean meant, poultry, fish, beans, tofu, etc. For more information go to www.thenutritionsource.org  Drink alcohol in moderation- Limit alcohol intake to less than two drinks a day. Never drink and drive.  Dentist- Brush and floss twice daily; visit your dentist twice a year.  Depression- Your emotional health is as important as your physical health. If you're feeling down, or losing interest in things you would normally enjoy please talk to your healthcare provider.  Eye exam- Visit your eye doctor every year.  Safe sex- If you may be exposed to a sexually transmitted infection, use a condom.  Seat belts- Seat belts can save your life; always wear one.  Smoke/Carbon Monoxide detectors- These detectors need to be installed on the appropriate level of your home.  Replace batteries at least once a year.  Skin cancer- When out in the sun, cover up and use sunscreen 15 SPF or higher.  Violence- If anyone is threatening you, please tell your healthcare provider.  Living Will/ Health care power of attorney- Speak with your healthcare provider and family.    If you have lab work done today you  will be contacted with your lab results within the next 2 weeks.  If you have not heard from [place] then please contact us. The fastest way to get your results is to register for My Chart.   IF you received an x-ray today, you will receive an invoice from [facility]. Please contact [facility] at [phone] with questions or concerns regarding your invoice.   IF you received labwork today, you will receive an  invoice from [facility]. Please contact [facility] at [phone] with questions or concerns regarding your invoice.   Our billing staff will not be able to assist you with questions regarding bills from these companies.  You will be contacted with the lab results as soon as they are available. The fastest way to get your results is to activate your My Chart account. Instructions are located on the last page of this paperwork. If you have not heard from [place] regarding the results in 2 weeks, please contact this office.

## 2020-04-09 LAB — LIPID PANEL
Chol/HDL Ratio: 3.4 ratio (ref 0.0–5.0)
Cholesterol, Total: 123 mg/dL (ref 100–199)
HDL: 36 mg/dL — ABNORMAL LOW (ref >39)
LDL Chol Calc (NIH): 74 mg/dL (ref 0–99)
Triglycerides: 62 mg/dL (ref 0–149)
VLDL Cholesterol Cal: 13 mg/dL (ref 5–40)

## 2020-04-09 LAB — CMP14+EGFR
ALT: 21 IU/L (ref 0–44)
AST: 21 IU/L (ref 0–40)
Albumin/Globulin Ratio: 1.8 (ref 1.2–2.2)
Albumin: 4.7 g/dL (ref 3.8–4.9)
Alkaline Phosphatase: 109 IU/L (ref 39–117)
BUN/Creatinine Ratio: 11 (ref 9–20)
BUN: 12 mg/dL (ref 6–24)
Bilirubin Total: 0.8 mg/dL (ref 0.0–1.2)
CO2: 25 mmol/L (ref 20–29)
Calcium: 9.8 mg/dL (ref 8.7–10.2)
Chloride: 103 mmol/L (ref 96–106)
Creatinine, Ser: 1.09 mg/dL (ref 0.76–1.27)
GFR calc Af Amer: 90 mL/min/{1.73_m2} (ref >59)
GFR calc non Af Amer: 78 mL/min/{1.73_m2} (ref >59)
Globulin, Total: 2.6 g/dL (ref 1.5–4.5)
Glucose: 105 mg/dL — ABNORMAL HIGH (ref 65–99)
Potassium: 4.4 mmol/L (ref 3.5–5.2)
Sodium: 142 mmol/L (ref 134–144)
Total Protein: 7.3 g/dL (ref 6.0–8.5)

## 2020-04-09 LAB — HIV ANTIBODY (ROUTINE TESTING W REFLEX): HIV Screen 4th Generation wRfx: NONREACTIVE

## 2020-04-09 LAB — HEMOGLOBIN A1C
Est. average glucose Bld gHb Est-mCnc: 126 mg/dL
Hgb A1c MFr Bld: 6 % — ABNORMAL HIGH (ref 4.8–5.6)

## 2020-04-09 LAB — RPR: RPR Ser Ql: NONREACTIVE

## 2020-04-11 LAB — GC/CHLAMYDIA PROBE AMP (~~LOC~~) NOT AT ARMC
Chlamydia: NEGATIVE
Comment: NEGATIVE
Comment: NORMAL
Neisseria Gonorrhea: NEGATIVE

## 2020-04-12 ENCOUNTER — Ambulatory Visit: Payer: BC Managed Care – PPO | Attending: Family

## 2020-04-12 DIAGNOSIS — Z23 Encounter for immunization: Secondary | ICD-10-CM

## 2020-04-12 NOTE — Progress Notes (Signed)
   Covid-19 Vaccination Clinic  Name:  Hunter Adams    MRN: [id number] DOB: [birth year]  04/12/2020  [name] was observed post Covid-19 immunization for 15 minutes without incident. He was provided with Vaccine Information Sheet and instruction to access the V-Safe system.   [name] was instructed to call 911 with any severe reactions post vaccine: Marland Kitchen Difficulty breathing  . Swelling of face and throat  . A fast heartbeat  . A bad rash all over body  . Dizziness and weakness   Immunizations Administered    Name Date Dose VIS Date Route   Moderna COVID-19 Vaccine 04/12/2020 11:42 AM 0.5 mL 12/01/2019 Intramuscular   Manufacturer: Moderna   Lot: [id number]   NDC: 17356-701-41

## 2020-04-14 ENCOUNTER — Ambulatory Visit: Payer: BC Managed Care – PPO

## 2020-04-21 ENCOUNTER — Encounter: Payer: Self-pay | Admitting: Family Medicine

## 2020-04-22 NOTE — Telephone Encounter (Signed)
Pt would like to know if 1,500 mg of Glucosamine chondroitin is too much for him to be taking.

## 2020-08-14 ENCOUNTER — Encounter: Payer: Self-pay | Admitting: Family Medicine

## 2020-08-14 DIAGNOSIS — N529 Male erectile dysfunction, unspecified: Secondary | ICD-10-CM

## 2020-08-16 MED ORDER — SILDENAFIL CITRATE 100 MG PO TABS: 50.0000 mg | ORAL_TABLET | Freq: Every day | ORAL | 6 refills | Status: DC | PRN

## 2020-08-16 NOTE — Telephone Encounter (Signed)
Annual physical exam in April.  Medication refilled.

## 2020-09-19 ENCOUNTER — Encounter: Payer: Self-pay | Admitting: Family Medicine

## 2020-09-30 ENCOUNTER — Telehealth: Payer: Self-pay | Admitting: Family Medicine

## 2020-09-30 NOTE — Telephone Encounter (Signed)
called pt LVM for pt to call back and reschedule /6 MONTH RECHECK ON HIS MEDICATION REVIEW appt was cancelled as provider not available

## 2020-10-07 ENCOUNTER — Ambulatory Visit: Payer: BC Managed Care – PPO | Admitting: Family Medicine

## 2020-10-12 ENCOUNTER — Ambulatory Visit: Payer: BC Managed Care – PPO | Admitting: Family Medicine

## 2020-10-12 ENCOUNTER — Other Ambulatory Visit: Payer: Self-pay

## 2020-10-12 ENCOUNTER — Encounter: Payer: Self-pay | Admitting: Family Medicine

## 2020-10-12 VITALS — BP 128/77 | HR 61 | Temp 97.9°F | Ht 68.5 in | Wt 234.6 lb

## 2020-10-12 DIAGNOSIS — M170 Bilateral primary osteoarthritis of knee: Secondary | ICD-10-CM

## 2020-10-12 DIAGNOSIS — Z6835 Body mass index (BMI) 35.0-35.9, adult: Secondary | ICD-10-CM

## 2020-10-12 DIAGNOSIS — M25562 Pain in left knee: Secondary | ICD-10-CM

## 2020-10-12 DIAGNOSIS — G4700 Insomnia, unspecified: Secondary | ICD-10-CM | POA: Insufficient documentation

## 2020-10-12 DIAGNOSIS — Z23 Encounter for immunization: Secondary | ICD-10-CM | POA: Diagnosis not present

## 2020-10-12 DIAGNOSIS — F3289 Other specified depressive episodes: Secondary | ICD-10-CM

## 2020-10-12 DIAGNOSIS — R7303 Prediabetes: Secondary | ICD-10-CM | POA: Diagnosis not present

## 2020-10-12 DIAGNOSIS — F32A Depression, unspecified: Secondary | ICD-10-CM | POA: Insufficient documentation

## 2020-10-12 DIAGNOSIS — I10 Essential (primary) hypertension: Secondary | ICD-10-CM | POA: Diagnosis not present

## 2020-10-12 DIAGNOSIS — M1990 Unspecified osteoarthritis, unspecified site: Secondary | ICD-10-CM | POA: Insufficient documentation

## 2020-10-12 DIAGNOSIS — G8929 Other chronic pain: Secondary | ICD-10-CM | POA: Insufficient documentation

## 2020-10-12 DIAGNOSIS — E785 Hyperlipidemia, unspecified: Secondary | ICD-10-CM

## 2020-10-12 DIAGNOSIS — M549 Dorsalgia, unspecified: Secondary | ICD-10-CM

## 2020-10-12 DIAGNOSIS — M25561 Pain in right knee: Secondary | ICD-10-CM

## 2020-10-12 LAB — POCT GLYCOSYLATED HEMOGLOBIN (HGB A1C): Hemoglobin A1C: 6 % — AB (ref 4.0–5.6)

## 2020-10-12 MED ORDER — AMLODIPINE BESYLATE 5 MG PO TABS: 5.0000 mg | ORAL_TABLET | Freq: Every day | ORAL | 1 refills | Status: DC

## 2020-10-12 MED ORDER — ATORVASTATIN CALCIUM 20 MG PO TABS: 20.0000 mg | ORAL_TABLET | Freq: Every day | ORAL | 1 refills | Status: DC

## 2020-10-12 MED ORDER — LISINOPRIL 20 MG PO TABS: 20.0000 mg | ORAL_TABLET | Freq: Every day | ORAL | 1 refills | Status: DC

## 2020-10-12 NOTE — Progress Notes (Addendum)
10/13/20219:47 AM  Hunter Adams 11-Mar-1970, 50 y.o., male [id number]  Chief Complaint  Patient presents with  . Medical Management of Chronic Issues    here for his 6 month f/u. Patient is not having any issues or problem at this time    HPI:   Patient is a 50 y.o. male with past medical history significant for HTN, Prediabetes, HLD who presents today for medication follow up.  Hyperlipidemia, unspecified hyperlipidemia type - Plan: Lipid panel, atorvastatin (LIPITOR) 20 MG tablet             -  Stable, tolerating current regimen. No new myalgias/side effects.  Lab Results  Component Value Date   CHOL 123 04/08/2020   HDL 36 (L) 04/08/2020   LDLCALC 74 04/08/2020   TRIG 62 04/08/2020   CHOLHDL 3.4 04/08/2020    Essential hypertension - Plan: CMP14+EGFR, amLODipine (NORVASC) 5 MG tablet, lisinopril (ZESTRIL) 20 MG tablet             -  Stable, tolerating current regimen. At goal <140/90. Check BP at home once per week.  BP Readings from Last 3 Encounters:  10/12/20 128/77  04/08/20 131/81  01/22/20 111/70    Prediabetes - Plan: Hemoglobin A1c Class 1 obesity without serious comorbidity with body mass index (BMI) of 34.0 to 34.9 in adult, unspecified obesity type             -Recheck A1c, commended on weight loss.  Commended on dietary changes. Lab Results  Component Value Date   HGBA1C 6.0 (A) 10/12/2020    Episodic knee pain and Back pain Bilateral.  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 Has tried topical arthritis cream at times or  No locking/swelling/giving way.  [facility].  [facility]: [facility]: Topical pain ointment, and knee braces to wear. Meloxicam.  Obesity Wt Readings from Last 3 Encounters:  10/12/20 234 lb 9.6 oz (106.4 kg)  04/08/20 230 lb (104.3 kg)  01/22/20 236 lb (107 kg)   BMI Readings from Last 3 Encounters:  10/12/20 35.15 kg/m  04/08/20 34.46 kg/m  01/22/20 35.88 kg/m   Depression [employer] [facility] Started on  Effexor. Trazodone for sleep as needed. Virtual counseling appointment. No current thoughts of suicide or self harm. PHQ-9 20.  Depression screen [facility] 2/9 10/12/2020 04/08/2020 08/24/2019  Decreased Interest 2 0 0  Down, Depressed, Hopeless 2 0 0  PHQ - 2 Score 4 0 0  Altered sleeping 2 - -  Tired, decreased energy 3 - -  Change in appetite 2 - -  Feeling bad or failure about yourself  3 - -  Trouble concentrating 2 - -  Moving slowly or fidgety/restless 2 - -  Suicidal thoughts 2 - -  PHQ-9 Score 20 - -  Difficult doing work/chores - - -    Fall Risk  10/12/2020 04/08/2020 08/24/2019 03/20/2019 06/20/2018  Falls in the past year? 0 0 0 0 No  Number falls in past yr: 0 - 0 0 -  Injury with Fall? 0 - 0 0 -  Follow up - Falls evaluation completed Falls evaluation completed Falls evaluation completed -     No Known Allergies  Prior to Admission medications   Medication Sig Start Date End Date Taking? Authorizing Provider  amLODipine (NORVASC) 5 MG tablet Take 1 tablet (5 mg total) by mouth daily. 10/12/20  Yes Kariss Longmire, Laurita Quint, FNP  atorvastatin (LIPITOR) 20 MG tablet Take 1 tablet (20 mg total) by mouth daily at 6 PM. 10/12/20  Yes Georgine Wiltse, Laurita Quint, FNP  B Complex-C (SUPER B COMPLEX/VITAMIN C) TABS Take 1 tablet by mouth. With zinc   Yes [provider]  lisinopril (ZESTRIL) 20 MG tablet Take 1 tablet (20 mg total) by mouth daily. 10/12/20  Yes Aryiah Monterosso, Laurita Quint, FNP  Multiple Vitamin (MULTIVITAMIN WITH MINERALS) TABS tablet Take 1 tablet by mouth daily.   Yes [provider]  sildenafil (VIAGRA) 100 MG tablet Take 0.5-1 tablets (50-100 mg total) by mouth daily as needed for erectile dysfunction. Max dose in 24 hour period is 174m. 08/16/20  Yes GWendie Agreste MD    Past Medical History:  Diagnosis Date  . Allergy    seasonal  . COVID-19 103/49/1791  no complications, no hospitalization  . Hyperlipidemia   . Hypertension     Past Surgical History:  Procedure  Laterality Date  . NO PAST SURGERIES      Social History   Tobacco Use  . Smoking status: Never Smoker  . Smokeless tobacco: Never Used  Substance Use Topics  . Alcohol use: Yes    Alcohol/week: 2.0 standard drinks    Types: 1 Glasses of wine, 1 Cans of beer per week    Family History  Problem Relation Age of Onset  . Hypertension Mother   . Diabetes Mother   . Hypertension Father   . Heart disease Father   . Hypertension Maternal Grandmother   . Hypertension Maternal Grandfather   . Hypertension Paternal Grandmother   . Hypertension Paternal Grandfather   . Colon cancer Neg Hx   . Colon polyps Neg Hx   . Esophageal cancer Neg Hx   . Rectal cancer Neg Hx   . Stomach cancer Neg Hx     Review of Systems  Constitutional: Negative for malaise/fatigue and weight loss.  Eyes: Negative for blurred vision and double vision.  Respiratory: Negative for cough, shortness of breath and wheezing.   Cardiovascular: Negative for chest pain, palpitations and leg swelling.  Gastrointestinal: Negative for abdominal pain, blood in stool, constipation, diarrhea, heartburn, nausea and vomiting.  Genitourinary: Negative for dysuria, frequency and hematuria.  Musculoskeletal: Positive for back pain and joint pain. Negative for myalgias.  Skin: Negative for rash.  Neurological: Negative for dizziness, weakness and headaches.  Psychiatric/Behavioral: Positive for depression. Negative for suicidal ideas. The patient has insomnia. The patient is not nervous/anxious.      OBJECTIVE:  Today's Vitals   10/12/20 0902  BP: 128/77  Pulse: 61  Temp: 97.9 F (36.6 C)  TempSrc: Temporal  SpO2: 97%  Weight: 234 lb 9.6 oz (106.4 kg)  Height: 5' 8.5" (1.74 m)   Body mass index is 35.15 kg/m.   Physical Exam Constitutional:      Appearance: Normal appearance.  HENT:     Head: Normocephalic and atraumatic.  Cardiovascular:     Rate and Rhythm: Normal rate and regular rhythm.     Pulses:  Normal pulses.     Heart sounds: Normal heart sounds. No murmur heard.  No friction rub. No gallop.   Pulmonary:     Effort: Pulmonary effort is normal. No respiratory distress.     Breath sounds: Normal breath sounds. No stridor. No wheezing, rhonchi or rales.  Abdominal:     General: Abdomen is flat. Bowel sounds are normal.     Palpations: Abdomen is soft.  Musculoskeletal:     Right knee: Normal.     Left knee: Normal.     Right lower leg: No  edema.     Left lower leg: No edema.  Neurological:     General: No focal deficit present.     Mental Status: He is alert and oriented to person, place, and time.  Psychiatric:        Mood and Affect: Mood normal.        Behavior: Behavior normal.     Results for orders placed or performed in visit on 10/12/20 (from the past 24 hour(s))  POCT glycosylated hemoglobin (Hb A1C)     Status: Abnormal   Collection Time: 10/12/20  9:33 AM  Result Value Ref Range   Hemoglobin A1C 6.0 (A) 4.0 - 5.6 %   HbA1c POC (<> result, manual entry)     HbA1c, POC (prediabetic range)     HbA1c, POC (controlled diabetic range)      No results found.   ASSESSMENT and PLAN  Problem List Items Addressed This Visit    None    Visit Diagnoses    Need for prophylactic vaccination and inoculation against influenza    -  Primary   Relevant Orders   Flu Vaccine QUAD 6+ mos PF IM (Fluarix Quad PF) (Completed)   Essential hypertension     At goal<140/90. Refills sent   Relevant Medications   amLODipine (NORVASC) 5 MG tablet   atorvastatin (LIPITOR) 20 MG tablet   lisinopril (ZESTRIL) 20 MG tablet   Other Relevant Orders   Comprehensive metabolic panel   Hyperlipidemia, unspecified hyperlipidemia type     No New myalgias. Refills sent   Relevant Medications   amLODipine (NORVASC) 5 MG tablet   atorvastatin (LIPITOR) 20 MG tablet   lisinopril (ZESTRIL) 20 MG tablet   Other Relevant Orders   Lipid Panel   Prediabetes       Relevant Orders   POCT  glycosylated hemoglobin (Hb A1C) (Completed): 6 Continue to work on diet and exercise.   Severe obesity (BMI 35.0-39.9) with comorbidity (HCC)       Pain in both knees, unspecified chronicity    [facility]:  Topical pain ointment, and knee braces to wear.  Meloxicam 58m daily   Back pain, unspecified back location, unspecified back pain laterality, unspecified chronicity       Insomnia, unspecified type    [facility]reated by [facility]:  Trazodone 544mfor sleep as needed.   Other depression   [facility]reated by [facility]:   Started on Zoloft 5010maily Attending Virtual counseling appointments. PHQ9 is 20, no current thoughts of suicide or self harm.       Return in about 6 months (around 04/12/2021) for Medication follow up.  I have reviewed and agree with above documentation. MigAgustina CaroliD   KelHuston Foleyst, FNP-BC [facility] [id number].  [phone]

## 2020-10-12 NOTE — Patient Instructions (Addendum)
Call if you need anything Will follow up lab results A1c 6. Continue to work on diet and exercise.    If you have lab work done today you will be contacted with your lab results within the next 2 weeks.  If you have not heard from [place] then please contact us. The fastest way to get your results is to register for My Chart.   IF you received an x-ray today, you will receive an invoice from [facility]. Please contact [facility] at [phone] with questions or concerns regarding your invoice.   IF you received labwork today, you will receive an invoice from [place]. Please contact [facility] at [phone] with questions or concerns regarding your invoice.   Our billing staff will not be able to assist you with questions regarding bills from these companies.  You will be contacted with the lab results as soon as they are available. The fastest way to get your results is to activate your My Chart account. Instructions are located on the last page of this paperwork. If you have not heard from [place] regarding the results in 2 weeks, please contact this office.      Health Maintenance, Male Adopting a healthy lifestyle and getting preventive care are important in promoting health and wellness. Ask your health care provider about:  The right schedule for you to have regular tests and exams.  Things you can do on your own to prevent diseases and keep yourself healthy. What should I know about diet, weight, and exercise? Eat a healthy diet   Eat a diet that includes plenty of vegetables, fruits, low-fat dairy products, and lean protein.  Do not eat a lot of foods that are high in solid fats, added sugars, or sodium. Maintain a healthy weight Body mass index (BMI) is a measurement that can be used to identify possible weight problems. It estimates body fat based on height and weight. Your health care provider can help determine your BMI and help you achieve or maintain a  healthy weight. Get regular exercise Get regular exercise. This is one of the most important things you can do for your health. Most adults should:  Exercise for at least 150 minutes each week. The exercise should increase your heart rate and make you sweat (moderate-intensity exercise).  Do strengthening exercises at least twice a week. This is in addition to the moderate-intensity exercise.  Spend less time sitting. Even light physical activity can be beneficial. Watch cholesterol and blood lipids Have your blood tested for lipids and cholesterol at 52 years of age, then have this test every 5 years. You may need to have your cholesterol levels checked more often if:  Your lipid or cholesterol levels are high.  You are older than 52 years of age.  You are at high risk for heart disease. What should I know about cancer screening? Many types of cancers can be detected early and may often be prevented. Depending on your health history and family history, you may need to have cancer screening at various ages. This may include screening for:  Colorectal cancer.  Prostate cancer.  Skin cancer.  Lung cancer. What should I know about heart disease, diabetes, and high blood pressure? Blood pressure and heart disease  High blood pressure causes heart disease and increases the risk of stroke. This is more likely to develop in people who have high blood pressure readings, are of African descent, or are overweight.  Talk with your health care provider about  your target blood pressure readings.  Have your blood pressure checked: ? Every 3-5 years if you are 7018-52 years of age. ? Every year if you are 52 years old or older.  If you are between the ages of 4565 and 4175 and are a current or former smoker, ask your health care provider if you should have a one-time screening for abdominal aortic aneurysm (AAA). Diabetes Have regular diabetes screenings. This checks your fasting blood sugar  level. Have the screening done:  Once every three years after age 52 if you are at a normal weight and have a low risk for diabetes.  More often and at a younger age if you are overweight or have a high risk for diabetes. What should I know about preventing infection? Hepatitis B If you have a higher risk for hepatitis B, you should be screened for this virus. Talk with your health care provider to find out if you are at risk for hepatitis B infection. Hepatitis C Blood testing is recommended for:  Everyone born from [id number] through 1967.  Anyone with known risk factors for hepatitis C. Sexually transmitted infections (STIs)  You should be screened each year for STIs, including gonorrhea and chlamydia, if: ? You are sexually active and are younger than 52 years of age. ? You are older than 52 years of age and your health care provider tells you that you are at risk for this type of infection. ? Your sexual activity has changed since you were last screened, and you are at increased risk for chlamydia or gonorrhea. Ask your health care provider if you are at risk.  Ask your health care provider about whether you are at high risk for HIV. Your health care provider may recommend a prescription medicine to help prevent HIV infection. If you choose to take medicine to prevent HIV, you should first get tested for HIV. You should then be tested every 3 months for as long as you are taking the medicine. Follow these instructions at home: Lifestyle  Do not use any products that contain nicotine or tobacco, such as cigarettes, e-cigarettes, and chewing tobacco. If you need help quitting, ask your health care provider.  Do not use street drugs.  Do not share needles.  Ask your health care provider for help if you need support or information about quitting drugs. Alcohol use  Do not drink alcohol if your health care provider tells you not to drink.  If you drink alcohol: ? Limit how much you  have to 0-2 drinks a day. ? Be aware of how much alcohol is in your drink. In the [place] one drink equals one 12 oz bottle of beer (355 mL), one 5 oz glass of wine (148 mL), or one 1 oz glass of hard liquor (44 mL). General instructions  Schedule regular health, dental, and eye exams.  Stay current with your vaccines.  Tell your health care provider if: ? You often feel depressed. ? You have ever been abused or do not feel safe at home. Summary  Adopting a healthy lifestyle and getting preventive care are important in promoting health and wellness.  Follow your health care provider's instructions about healthy diet, exercising, and getting tested or screened for diseases.  Follow your health care provider's instructions on monitoring your cholesterol and blood pressure. This information is not intended to replace advice given to you by your health care provider. Make sure you discuss any questions you have with your health care provider.  Document Revised: 12/10/2018 Document Reviewed: 12/10/2018 Elsevier Patient Education  2020 [employer].  Depression Screening Depression screening is a tool that your health care provider can use to learn if you have symptoms of depression. Depression is a common condition with many symptoms that are also often found in other conditions. Depression is treatable, but it must first be diagnosed. You may not know that certain feelings, thoughts, and behaviors that you are having can be symptoms of depression. Taking a depression screening test can help you and your health care provider decide if you need more assessment, or if you should be referred to a mental health care provider. What are the screening tests?  You may have a physical exam to see if another condition is affecting your mental health. You may have a blood or urine sample taken during the physical exam.  You may be interviewed using a screening tool that was developed from research, such  as one of these: ? Patient Health Questionnaire (PHQ). This is a set of either 2 or 9 questions. A health care provider who has been trained to score this screening test uses a guide to assess if your symptoms suggest that you may have depression. ? Hamilton Depression Rating Scale (HAM-D). This is a set of either 17 or 24 questions. You may be asked to take it again during or after your treatment, to see if your depression has gotten better. ? Beck Depression Inventory (BDI). This is a set of 21 multiple choice questions. Your health care provider scores your answers to assess:  Your level of depression, ranging from mild to severe.  Your response to treatment.  Your health care provider may talk with you about your daily activities, such as eating, sleeping, work, and recreation, and ask if you have had any changes in activity.  Your health care provider may ask you to see a mental health specialist, such as a psychiatrist or psychologist, for more evaluation. Who should be screened for depression?   All adults, including adults with a family history of a mental health disorder.  Adolescents who are 49-10 years old.  People who are recovering from a myocardial infarction (MI).  Pregnant women, or women who have given birth.  People who have a long-term (chronic) illness.  Anyone who has been diagnosed with another type of a mental health disorder.  Anyone who has symptoms that could show depression. What do my results mean? Your health care provider will review the results of your depression screening, physical exam, and lab tests. Positive screens suggest that you may have depression. Screening is the first step in getting the care that you may need. It is up to you to get your screening results. Ask your health care provider, or the department that is doing your screening tests, when your results will be ready. Talk with your health care provider about your results and diagnosis. A  diagnosis of depression is made using the Diagnostic and Statistical Manual of Mental Disorders (DSM-V). This is a book that lists the number and type of symptoms that must be present for a health care provider to give a specific diagnosis.  Your health care provider may work with you to treat your symptoms of depression, or your health care provider may help you find a mental health provider who can assess, diagnose, and treat your depression. Get help right away if:  You have thoughts about hurting yourself or others. If you ever feel like you may hurt yourself  or others, or have thoughts about taking your own life, get help right away. You can go to your nearest emergency department or call:  Your local emergency services (911 in the [place]  A suicide crisis helpline, such as the National Suicide Prevention Lifeline at 517-136-3045. This is open 24 hours a day. Summary  Depression screening is the first step in getting the help that you may need.  If your screening test shows symptoms of depression (is positive), your health care provider may ask you to see a mental health provider.  Anyone who is age 80 or older should be screened for depression. This information is not intended to replace advice given to you by your health care provider. Make sure you discuss any questions you have with your health care provider. Document Revised: 11/29/2017 Document Reviewed: 05/03/2017 Elsevier Patient Education  2020 [employer].

## 2020-10-13 LAB — COMPREHENSIVE METABOLIC PANEL
ALT: 31 IU/L (ref 0–44)
AST: 28 IU/L (ref 0–40)
Albumin/Globulin Ratio: 1.7 (ref 1.2–2.2)
Albumin: 4.7 g/dL (ref 3.8–4.9)
Alkaline Phosphatase: 110 IU/L (ref 44–121)
BUN/Creatinine Ratio: 10 (ref 9–20)
BUN: 12 mg/dL (ref 6–24)
Bilirubin Total: 0.6 mg/dL (ref 0.0–1.2)
CO2: 26 mmol/L (ref 20–29)
Calcium: 9.5 mg/dL (ref 8.7–10.2)
Chloride: 105 mmol/L (ref 96–106)
Creatinine, Ser: 1.21 mg/dL (ref 0.76–1.27)
GFR calc Af Amer: 80 mL/min/{1.73_m2} (ref >59)
GFR calc non Af Amer: 69 mL/min/{1.73_m2} (ref >59)
Globulin, Total: 2.7 g/dL (ref 1.5–4.5)
Glucose: 97 mg/dL (ref 65–99)
Potassium: 4.2 mmol/L (ref 3.5–5.2)
Sodium: 145 mmol/L — ABNORMAL HIGH (ref 134–144)
Total Protein: 7.4 g/dL (ref 6.0–8.5)

## 2020-10-13 LAB — LIPID PANEL
Chol/HDL Ratio: 3.9 ratio (ref 0.0–5.0)
Cholesterol, Total: 140 mg/dL (ref 100–199)
HDL: 36 mg/dL — ABNORMAL LOW (ref >39)
LDL Chol Calc (NIH): 89 mg/dL (ref 0–99)
Triglycerides: 75 mg/dL (ref 0–149)
VLDL Cholesterol Cal: 15 mg/dL (ref 5–40)

## 2020-10-19 ENCOUNTER — Encounter: Payer: Self-pay | Admitting: Family Medicine

## 2020-10-20 NOTE — Telephone Encounter (Signed)
Were you waiting on there? Pt reporting takes these medications I see they're in the med list but not as something we prescribed for this pt

## 2020-10-28 NOTE — Telephone Encounter (Signed)
Pt did mean 50 mg for each

## 2020-11-04 ENCOUNTER — Ambulatory Visit: Payer: BC Managed Care – PPO | Attending: Family

## 2020-11-04 DIAGNOSIS — Z23 Encounter for immunization: Secondary | ICD-10-CM

## 2021-01-23 NOTE — Progress Notes (Signed)
   Covid-19 Vaccination Clinic  Name:  Hunter Adams    MRN: [id number] DOB: 01/15/1970  01/23/2021  Mr. [name] was observed post Covid-19 immunization for 15 minutes without incident. He was provided with Vaccine Information Sheet and instruction to access the V-Safe system.   Mr. [name] was instructed to call 911 with any severe reactions post vaccine: Marland Kitchen Difficulty breathing  . Swelling of face and throat  . A fast heartbeat  . A bad rash all over body  . Dizziness and weakness   Immunizations Administered    Name Date Dose VIS Date Route   Moderna Covid-19 Booster Vaccine 11/04/2020 12:20 PM 0.25 mL 10/19/2020 Intramuscular   Manufacturer: Moderna   Lot: [id number]   NDC: 78675-449-20

## 2021-03-16 ENCOUNTER — Other Ambulatory Visit: Payer: Self-pay | Admitting: Family Medicine

## 2021-03-16 DIAGNOSIS — E785 Hyperlipidemia, unspecified: Secondary | ICD-10-CM

## 2021-03-16 NOTE — Telephone Encounter (Signed)
Requested Prescriptions  Pending Prescriptions Disp Refills  . atorvastatin (LIPITOR) 20 MG tablet [Pharmacy Med Name: Atorvastatin Calcium 20 MG Oral Tablet] 90 tablet 0    Sig: TAKE 1 TABLET BY MOUTH ONCE DAILY 6 IN THE EVENING     Cardiovascular:  Antilipid - Statins Failed - 03/16/2021  6:57 AM      Failed - LDL in normal range and within 360 days    LDL Chol Calc (NIH)  Date Value Ref Range Status  10/12/2020 89 0 - 99 mg/dL Final         Failed - HDL in normal range and within 360 days    HDL  Date Value Ref Range Status  10/12/2020 36 (L) >39 mg/dL Final         Passed - Total Cholesterol in normal range and within 360 days    Cholesterol, Total  Date Value Ref Range Status  10/12/2020 140 100 - 199 mg/dL Final         Passed - Triglycerides in normal range and within 360 days    Triglycerides  Date Value Ref Range Status  10/12/2020 75 0 - 149 mg/dL Final         Passed - Patient is not pregnant      Passed - Valid encounter within last 12 months    Recent Outpatient Visits          5 months ago Need for prophylactic vaccination and inoculation against influenza   [facility], Azalee Course, FNP   11 months ago Annual physical exam   [facility], Asencion Partridge, MD   1 year ago Need for influenza vaccination   [facility], Asencion Partridge, MD   1 year ago Essential hypertension   [facility], Asencion Partridge, MD   1 year ago Annual physical exam   [facility], Asencion Partridge, MD      Future Appointments            In 2 months Neva Seat Asencion Partridge, MD [facility]

## 2021-04-12 ENCOUNTER — Ambulatory Visit: Payer: Self-pay | Admitting: Family Medicine

## 2021-04-18 ENCOUNTER — Other Ambulatory Visit: Payer: Self-pay

## 2021-04-18 ENCOUNTER — Encounter: Payer: Self-pay | Admitting: Family Medicine

## 2021-04-18 ENCOUNTER — Other Ambulatory Visit: Payer: Self-pay | Admitting: Family Medicine

## 2021-04-18 DIAGNOSIS — I10 Essential (primary) hypertension: Secondary | ICD-10-CM

## 2021-04-18 MED ORDER — AMLODIPINE BESYLATE 5 MG PO TABS: 5.0000 mg | ORAL_TABLET | Freq: Every day | ORAL | 1 refills | Status: DC

## 2021-04-18 NOTE — Telephone Encounter (Signed)
Medication sent to the pharmacy.

## 2021-04-18 NOTE — Telephone Encounter (Signed)
Medication was sent to the pharmacy  °

## 2021-05-15 ENCOUNTER — Other Ambulatory Visit: Payer: Self-pay

## 2021-05-15 ENCOUNTER — Encounter: Payer: Self-pay | Admitting: Family Medicine

## 2021-05-15 DIAGNOSIS — I10 Essential (primary) hypertension: Secondary | ICD-10-CM

## 2021-05-15 MED ORDER — LISINOPRIL 20 MG PO TABS: 20.0000 mg | ORAL_TABLET | Freq: Every day | ORAL | 0 refills | Status: DC

## 2021-05-18 ENCOUNTER — Ambulatory Visit: Payer: BC Managed Care – PPO | Admitting: Family Medicine

## 2021-05-18 ENCOUNTER — Other Ambulatory Visit: Payer: Self-pay

## 2021-05-18 ENCOUNTER — Encounter: Payer: Self-pay | Admitting: Family Medicine

## 2021-05-18 VITALS — BP 116/78 | HR 55 | Temp 98.1°F | Resp 16 | Ht 68.5 in | Wt 234.8 lb

## 2021-05-18 DIAGNOSIS — R7303 Prediabetes: Secondary | ICD-10-CM | POA: Diagnosis not present

## 2021-05-18 DIAGNOSIS — N529 Male erectile dysfunction, unspecified: Secondary | ICD-10-CM

## 2021-05-18 DIAGNOSIS — I10 Essential (primary) hypertension: Secondary | ICD-10-CM

## 2021-05-18 DIAGNOSIS — E785 Hyperlipidemia, unspecified: Secondary | ICD-10-CM | POA: Diagnosis not present

## 2021-05-18 DIAGNOSIS — F3289 Other specified depressive episodes: Secondary | ICD-10-CM

## 2021-05-18 DIAGNOSIS — G4700 Insomnia, unspecified: Secondary | ICD-10-CM

## 2021-05-18 MED ORDER — SILDENAFIL CITRATE 100 MG PO TABS: 50.0000 mg | ORAL_TABLET | Freq: Every day | ORAL | 6 refills | Status: DC | PRN

## 2021-05-18 MED ORDER — ATORVASTATIN CALCIUM 20 MG PO TABS: ORAL_TABLET | ORAL | 1 refills | Status: DC

## 2021-05-18 MED ORDER — LISINOPRIL 20 MG PO TABS: 20.0000 mg | ORAL_TABLET | Freq: Every day | ORAL | 1 refills | Status: DC

## 2021-05-18 MED ORDER — AMLODIPINE BESYLATE 5 MG PO TABS: 5.0000 mg | ORAL_TABLET | Freq: Every day | ORAL | 1 refills | Status: DC

## 2021-05-18 NOTE — Patient Instructions (Addendum)
Send me a copy of your most recent labs form the [place]. No med changes at this time, but if you are having more depression symptoms we could try higher dose of Zoloft (100mg  per day). This can be discussed with your psychiatrist at upcoming appointment as well.   Recheck in 6 months for a physical. Please let me know if there are questions.

## 2021-05-18 NOTE — Progress Notes (Addendum)
Subjective:  Patient ID: Hunter Adams, male    DOB: [birth year]  Age: 51 y.o. MRN: [id number]  CC:  Chief Complaint  Patient presents with  . Hypertension    Pt denies physical symptoms, doing well no concerns but does need refills today     HPI Hunter Adams presents for   Hypertension: Amlodipine 5 mg daily, lisinopril 20 mg daily. Home readings: stable at [facility] and home readings stable. 115-122/80 or below.  bloodwork at [facility] recently - past few months.  BP Readings from Last 3 Encounters:  05/18/21 116/78  10/12/20 128/77  04/08/20 131/81   Lab Results  Component Value Date   CREATININE 1.21 10/12/2020   Hyperlipidemia: Lipitor 20 mg daily. bloodwork at [facility] recently. No new myalgias/side effects.  Lab Results  Component Value Date   CHOL 140 10/12/2020   HDL 36 (L) 10/12/2020   LDLCALC 89 10/12/2020   TRIG 75 10/12/2020   CHOLHDL 3.9 10/12/2020   Lab Results  Component Value Date   ALT 31 10/12/2020   AST 28 10/12/2020   ALKPHOS 110 10/12/2020   BILITOT 0.6 10/12/2020   Prediabetes: Diet/exercise approach.  Last A1c in October 2021.  Weight stable. ? 5.9 on recent A1C Some sweet tea on occasion, some juice. No soda. Exercise - walking. Knee surgery planned for meniscus tears - L knee surgery July 14th, S/P PT for low back pain, spinal stenosis, chronic LBP.   Lab Results  Component Value Date   HGBA1C 6.0 (A) 10/12/2020   Wt Readings from Last 3 Encounters:  05/18/21 234 lb 12.8 oz (106.5 kg)  10/12/20 234 lb 9.6 oz (106.4 kg)  04/08/20 230 lb (104.3 kg)    Depression: Treated with Effexor, trazodone for sleep as needed - about once per week. Followed by [facility] as well. Up and down. Some days feeling down, but not daily. No SI/HI.  Alcohol - 1 per week.  Has appt with psychiatrist in few weeks at [facility]  Depression screen [facility] 2/9 05/18/2021 05/18/2021 10/12/2020 04/08/2020 08/24/2019  Decreased Interest 0 0 2 0 0  Down, Depressed, Hopeless 1 1 2  0 0   PHQ - 2 Score 1 1 4  0 0  Altered sleeping 2 - 2 - -  Tired, decreased energy 2 - 3 - -  Change in appetite 1 - 2 - -  Feeling bad or failure about yourself  2 - 3 - -  Trouble concentrating 0 - 2 - -  Moving slowly or fidgety/restless 0 - 2 - -  Suicidal thoughts 0 - 2 - -  PHQ-9 Score 8 - 20 - -  Difficult doing work/chores - - - - -    Erectile dysfunction: viagra 1/2 pill - some HA/flushing. Drinks more water to help.  Working well. No hearing or vision changes. No CP with exertion.    History Patient Active Problem List   Diagnosis Date Noted  . Depression 10/12/2020  . Severe obesity (BMI 35.0-35.9 with comorbidity) (HCC) 10/12/2020  . Degenerative joint disease 10/12/2020  . Insomnia 10/12/2020  . Knee pain, chronic 10/12/2020  . HTN (hypertension) 06/17/2014   Past Medical History:  Diagnosis Date  . Allergy    seasonal  . COVID-19 10/23/2019   no complications, no hospitalization  . Hyperlipidemia   . Hypertension    Past Surgical History:  Procedure Laterality Date  . NO PAST SURGERIES     No Known Allergies Prior to Admission medications   Medication Sig  Start Date End Date Taking? Authorizing Provider  amLODipine (NORVASC) 5 MG tablet Take 1 tablet (5 mg total) by mouth daily. 04/18/21  Yes Shade Flood, MD  atorvastatin (LIPITOR) 20 MG tablet TAKE 1 TABLET BY MOUTH ONCE DAILY 6 IN THE EVENING 03/16/21  Yes Shade Flood, MD  lisinopril (ZESTRIL) 20 MG tablet Take 1 tablet (20 mg total) by mouth daily. 05/15/21  Yes Shade Flood, MD  meloxicam (MOBIC) 15 MG tablet Take 15 mg by mouth daily. 09/15/20  Yes [provider]  Multiple Vitamin (MULTIVITAMIN WITH MINERALS) TABS tablet Take 1 tablet by mouth daily.   Yes [provider]  sertraline (ZOLOFT) 50 MG tablet Take by mouth. 09/25/20  Yes [provider]  sildenafil (VIAGRA) 100 MG tablet Take 0.5-1 tablets (50-100 mg total) by mouth daily as needed for erectile  dysfunction. Max dose in 24 hour period is 100mg . 08/16/20  Yes 08/18/20, MD  traZODone (DESYREL) 50 MG tablet  09/25/20  Yes [provider]   Social History   Socioeconomic History  . Marital status: Single    Spouse name: Not on file  . Number of children: 3  . Years of education: Not on file  . Highest education level: Not on file  Occupational History  . Not on file  Tobacco Use  . Smoking status: Never Smoker  . Smokeless tobacco: Never Used  Vaping Use  . Vaping Use: Never used  Substance and Sexual Activity  . Alcohol use: Yes    Alcohol/week: 2.0 standard drinks    Types: 1 Glasses of wine, 1 Cans of beer per week  . Drug use: No  . Sexual activity: Yes    Partners: Female    Comment: with multiple partners, does not use condoms always  Other Topics Concern  . Not on file  Social History Narrative   Pt is from [place], [place]. Has lived in [place] since 1998. Has three children. Lives at home alone. Works at [employer] at [employer].    Social Determinants of Health   Financial Resource Strain: Not on file  Food Insecurity: Not on file  Transportation Needs: Not on file  Physical Activity: Not on file  Stress: Not on file  Social Connections: Not on file  Intimate Partner Violence: Not on file    Review of Systems  Constitutional: Negative for fatigue and unexpected weight change.  Eyes: Negative for visual disturbance.  Respiratory: Negative for cough, chest tightness and shortness of breath.   Cardiovascular: Negative for chest pain, palpitations and leg swelling.  Gastrointestinal: Negative for abdominal pain and blood in stool.  Neurological: Negative for dizziness, light-headedness and headaches.     Objective:   Vitals:   05/18/21 0856  BP: 116/78  Pulse: (!) 55  Resp: 16  Temp: 98.1 F (36.7 C)  TempSrc: Temporal  SpO2: 97%  Weight: 234 lb 12.8 oz (106.5 kg)  Height: 5' 8.5" (1.74 m)     Physical  Exam Vitals reviewed.  Constitutional:      Appearance: He is well-developed.  HENT:     Head: Normocephalic and atraumatic.  Eyes:     Pupils: Pupils are equal, round, and reactive to light.  Neck:     Vascular: No carotid bruit or JVD.  Cardiovascular:     Rate and Rhythm: Normal rate and regular rhythm.     Heart sounds: Normal heart sounds. No murmur heard.   Pulmonary:  Effort: Pulmonary effort is normal.     Breath sounds: Normal breath sounds. No rales.  Skin:    General: Skin is warm and dry.  Neurological:     Mental Status: He is alert and oriented to person, place, and time.  Psychiatric:        Mood and Affect: Mood normal.        Behavior: Behavior normal.        Thought Content: Thought content normal.        Judgment: Judgment normal.        Assessment & Plan:  Hunter Adams is a 51 y.o. male . Prediabetes  -Continue exercise, watching diet.  Caution with juices due to sugar intake.  He will provide copy of his recent labs from the [place].  Deferred labs in office today.  Hyperlipidemia, unspecified hyperlipidemia type - Plan: atorvastatin (LIPITOR) 20 MG tablet  -Tolerating current statin, outside labs requested.  Continue same dose  Essential hypertension - Plan: lisinopril (ZESTRIL) 20 MG tablet, amLODipine (NORVASC) 5 MG tablet  -Stable, continue same regimen.  Erectile dysfunction, unspecified erectile dysfunction type - Plan: sildenafil (VIAGRA) 100 MG tablet  -Minimal side effects and tolerating current dose.  Effective at current dosing with half pill at a time.viagra Rx given - use lowest effective dose. Side effects discussed (including but not limited to headache/flushing, blue discoloration of vision, possible vascular steal and risk of cardiac effects if underlying unknown coronary artery disease, and permanent sensorineural hearing loss). Understanding expressed.  Other depression Insomnia, unspecified type  -Insomnia controlled with  trazodone.  Episodic down/depression symptoms but declines change in meds for now.  Option of higher dose of Zoloft but this can be discussed with his psychiatrist at upcoming appointment.  Addendum 05/23/21: Received outside labs from 04/07/2021.  PSA 3.5, CBC overall normal, A1c 6.2, hepatic function panel normal.  Lipids with total cholesterol 113, triglycerides 43, HDL 38, LDL 66.  Electrolytes normal. No change in plan above.   Meds ordered this encounter  Medications  . atorvastatin (LIPITOR) 20 MG tablet    Sig: TAKE 1 TABLET BY MOUTH ONCE DAILY 6 IN THE EVENING    Dispense:  90 tablet    Refill:  1  . lisinopril (ZESTRIL) 20 MG tablet    Sig: Take 1 tablet (20 mg total) by mouth daily.    Dispense:  90 tablet    Refill:  1  . amLODipine (NORVASC) 5 MG tablet    Sig: Take 1 tablet (5 mg total) by mouth daily.    Dispense:  90 tablet    Refill:  1  . sildenafil (VIAGRA) 100 MG tablet    Sig: Take 0.5-1 tablets (50-100 mg total) by mouth daily as needed for erectile dysfunction. Max dose in 24 hour period is 100mg .    Dispense:  5 tablet    Refill:  6   Patient Instructions  Send me a copy of your most recent labs form the . No med changes at this time, but if you are having more depression symptoms we could try higher dose of Zoloft (100mg  per day). This can be discussed with your psychiatrist at upcoming appointment as well.   Recheck in 6 months for a physical. Please let me know if there are questions.      Signed, [place], MD [facility]

## 2021-07-13 HISTORY — PX: KNEE ARTHROSCOPY W/ MENISCECTOMY: SHX1879

## 2021-10-12 ENCOUNTER — Other Ambulatory Visit: Payer: Self-pay | Admitting: Family Medicine

## 2021-10-12 ENCOUNTER — Ambulatory Visit: Payer: BC Managed Care – PPO | Attending: Family

## 2021-10-12 DIAGNOSIS — I10 Essential (primary) hypertension: Secondary | ICD-10-CM

## 2021-10-12 DIAGNOSIS — Z23 Encounter for immunization: Secondary | ICD-10-CM

## 2021-10-12 NOTE — Progress Notes (Signed)
   Covid-19 Vaccination Clinic  Name:  Hunter Adams    MRN: [id number] DOB: 1970/01/16  10/12/2021  [name] was observed post Covid-19 immunization for 15 minutes without incident. He was provided with Vaccine Information Sheet and instruction to access the V-Safe system.   [name] was instructed to call 911 with any severe reactions post vaccine: Difficulty breathing  Swelling of face and throat  A fast heartbeat  A bad rash all over body  Dizziness and weakness

## 2021-11-08 ENCOUNTER — Ambulatory Visit (INDEPENDENT_AMBULATORY_CARE_PROVIDER_SITE_OTHER): Payer: BC Managed Care – PPO | Admitting: Family Medicine

## 2021-11-08 ENCOUNTER — Encounter: Payer: Self-pay | Admitting: Family Medicine

## 2021-11-08 VITALS — BP 120/76 | HR 66 | Temp 98.0°F | Resp 15 | Ht 68.5 in | Wt 242.6 lb

## 2021-11-08 DIAGNOSIS — Z125 Encounter for screening for malignant neoplasm of prostate: Secondary | ICD-10-CM

## 2021-11-08 DIAGNOSIS — R7303 Prediabetes: Secondary | ICD-10-CM

## 2021-11-08 DIAGNOSIS — E785 Hyperlipidemia, unspecified: Secondary | ICD-10-CM

## 2021-11-08 DIAGNOSIS — Z23 Encounter for immunization: Secondary | ICD-10-CM

## 2021-11-08 DIAGNOSIS — I10 Essential (primary) hypertension: Secondary | ICD-10-CM

## 2021-11-08 DIAGNOSIS — Z0000 Encounter for general adult medical examination without abnormal findings: Secondary | ICD-10-CM

## 2021-11-08 DIAGNOSIS — F3289 Other specified depressive episodes: Secondary | ICD-10-CM

## 2021-11-08 DIAGNOSIS — Z130 Encounter for screening for diseases of the blood and blood-forming organs and certain disorders involving the immune mechanism: Secondary | ICD-10-CM

## 2021-11-08 MED ORDER — AMLODIPINE BESYLATE 5 MG PO TABS: 5.0000 mg | ORAL_TABLET | Freq: Every day | ORAL | 1 refills | Status: DC

## 2021-11-08 MED ORDER — LISINOPRIL 20 MG PO TABS: 20.0000 mg | ORAL_TABLET | Freq: Every day | ORAL | 1 refills | Status: DC

## 2021-11-08 MED ORDER — ATORVASTATIN CALCIUM 20 MG PO TABS: ORAL_TABLET | ORAL | 1 refills | Status: DC

## 2021-11-08 NOTE — Patient Instructions (Addendum)
Send me a copy of recent labs to decide if others needed.   [facility] I would consider higher dose of sertraline if continued depression symptoms.  If you want to make a change, can take 2 of your current prescription and let me know how that is working.  I can send in the 100 mg prescription if that does better and you tolerate that dose.  Call 988, or suicide hotline if suicidal thoughts occur.   Thanks for coming in today.  Take care.   Preventive Care 94-11 Years Old, Male Preventive care refers to lifestyle choices and visits with your health care provider that can promote health and wellness. Preventive care visits are also called wellness exams. What can I expect for my preventive care visit? Counseling During your preventive care visit, your health care provider may ask about your: Medical history, including: Past medical problems. Family medical history. Current health, including: Emotional well-being. Home life and relationship well-being. Sexual activity. Lifestyle, including: Alcohol, nicotine or tobacco, and drug use. Access to firearms. Diet, exercise, and sleep habits. Safety issues such as seatbelt and bike helmet use. Sunscreen use. Work and work Astronomer. Physical exam Your health care provider will check your: Height and weight. These may be used to calculate your BMI (body mass index). BMI is a measurement that tells if you are at a healthy weight. Waist circumference. This measures the distance around your waistline. This measurement also tells if you are at a healthy weight and may help predict your risk of certain diseases, such as type 2 diabetes and high blood pressure. Heart rate and blood pressure. Body temperature. Skin for abnormal spots. What immunizations do I need? Vaccines are usually given at various ages, according to a schedule. Your health care provider will recommend vaccines for you based on your age, medical history,  and lifestyle or other factors, such as travel or where you work. What tests do I need? Screening Your health care provider may recommend screening tests for certain conditions. This may include: Lipid and cholesterol levels. Diabetes screening. This is done by checking your blood sugar (glucose) after you have not eaten for a while (fasting). Hepatitis B test. Hepatitis C test. HIV (human immunodeficiency virus) test. STI (sexually transmitted infection) testing, if you are at risk. Lung cancer screening. Prostate cancer screening. Colorectal cancer screening. Talk with your health care provider about your test results, treatment options, and if necessary, the need for more tests. Follow these instructions at home: Eating and drinking  Eat a diet that includes fresh fruits and vegetables, whole grains, lean protein, and low-fat dairy products. Take vitamin and mineral supplements as recommended by your health care provider. Do not drink alcohol if your health care provider tells you not to drink. If you drink alcohol: Limit how much you have to 0-2 drinks a day. Know how much alcohol is in your drink. In the [place] one drink equals one 12 oz bottle of beer (355 mL), one 5 oz glass of wine (148 mL), or one 1 oz glass of hard liquor (44 mL). Lifestyle Brush your teeth every morning and night with fluoride toothpaste. Floss one time each day. Exercise for at least 30 minutes 5 or more days each week. Do not use any products that contain nicotine or tobacco. These products include cigarettes, chewing tobacco, and vaping devices, such as e-cigarettes. If you need help quitting, ask your health care provider. Do not use drugs. If you are sexually  active, practice safe sex. Use a condom or other form of protection to prevent STIs. Take aspirin only as told by your health care provider. Make sure that you understand how much to take and what form to take. Work with your health care provider to  find out whether it is safe and beneficial for you to take aspirin daily. Find healthy ways to manage stress, such as: Meditation, yoga, or listening to music. Journaling. Talking to a trusted person. Spending time with friends and family. Minimize exposure to UV radiation to reduce your risk of skin cancer. Safety Always wear your seat belt while driving or riding in a vehicle. Do not drive: If you have been drinking alcohol. Do not ride with someone who has been drinking. When you are tired or distracted. While texting. If you have been using any mind-altering substances or drugs. Wear a helmet and other protective equipment during sports activities. If you have firearms in your house, make sure you follow all gun safety procedures. What's next? Go to your health care provider once a year for an annual wellness visit. Ask your health care provider how often you should have your eyes and teeth checked. Stay up to date on all vaccines. This information is not intended to replace advice given to you by your health care provider. Make sure you discuss any questions you have with your health care provider. Document Revised: 06/14/2021 Document Reviewed: 06/14/2021 Elsevier Patient Education  2022 [employer].

## 2021-11-08 NOTE — Progress Notes (Signed)
Subjective:  Patient ID: Hunter Adams, male    DOB: [birth year]  Age: 51 y.o. MRN: [id number]  CC:  Chief Complaint  Patient presents with   Annual Exam    Pt here for annual exam, due for some HTN refills but pt reports doing well, no concerns no physical sxs, pt would like to discuss flu shot today, had COVID booster yesterday     HPI Hunter Adams presents for   Annual physical  L knee surgery in July - 80% better. Gout, arthritis.   Hypertension: Lisinopril 20 mg daily, amlodipine 5 mg daily Home readings: 120/70 range.  No new med side effects.  BP Readings from Last 3 Encounters:  11/08/21 120/76  05/18/21 116/78  10/12/20 128/77   Lab Results  Component Value Date   CREATININE 1.21 10/12/2020   Hyperlipidemia: Lipitor 20 mg daily. No new myalgias, side effects.  Lab Results  Component Value Date   CHOL 140 10/12/2020   HDL 36 (L) 10/12/2020   LDLCALC 89 10/12/2020   TRIG 75 10/12/2020   CHOLHDL 3.9 10/12/2020   Lab Results  Component Value Date   ALT 31 10/12/2020   AST 28 10/12/2020   ALKPHOS 110 10/12/2020   BILITOT 0.6 10/12/2020    Gout: Last flare: unknown - recent diagnosis.  Daily meds: Allopurinol 100 mg BID Prn med: mobic 15mg  qd from .  No results found for: LABURIC [facility]. ? Bloodwork in past 6 weeks - will send to me.  Now followed by rheumatology.   Prediabetes: A1c 6.2 in April at the May.  Some weight gain as below.less exercise after surgery - getting better. Weight up and down.  Exercise: increasing. Some resistance upper body, walking at work.  Fast food/sweet tea/soda - avoids. Occasional juice.  Lab Results  Component Value Date   HGBA1C 6.0 (A) 10/12/2020   Wt Readings from Last 3 Encounters:  11/08/21 242 lb 9.6 oz (110 kg)  05/18/21 234 lb 12.8 oz (106.5 kg)  10/12/20 234 lb 9.6 oz (106.4 kg)   Depression: Treated with Zoloft 50 mg daily, trazodone 50 mg nightly as needed - once per week.   [facility]  Denies active suicidal thoughts. Sometimes looks back at life with some regrets. No specific SI, intent or plan. Has called suicide hotline. No prior attempts.   Depression screen [facility] 2/9 11/08/2021 05/18/2021 05/18/2021 10/12/2020 04/08/2020  Decreased Interest 2 0 0 2 0  Down, Depressed, Hopeless 1 1 1 2  0  PHQ - 2 Score 3 1 1 4  0  Altered sleeping 1 2 - 2 -  Tired, decreased energy 2 2 - 3 -  Change in appetite 1 1 - 2 -  Feeling bad or failure about yourself  2 2 - 3 -  Trouble concentrating 0 0 - 2 -  Moving slowly or fidgety/restless 1 0 - 2 -  Suicidal thoughts 1 0 - 2 -  PHQ-9 Score 11 8 - 20 -  Difficult doing work/chores - - - - -   Cancer screening PSA 3.5 in April of this year at the . CBC, CMP overall reassuring at this time. Colonoscopy 01/22/20  Immunization History  Administered Date(s) Administered   Influenza,inj,Quad PF,6+ Mos 02/28/2016, 10/14/2019, 10/12/2020, 11/08/2021   Moderna Covid-19 Vaccine Bivalent Booster 27yrs & up 10/12/2021   Moderna SARS-COV2 Booster Vaccination 11/04/2020   Moderna Sars-Covid-2 Vaccination 03/10/2020, 04/12/2020   Tdap 06/17/2014  Covid booster yesterday.  Shingrix at [place] earlier this year.  Flu vaccine today.   Vision Screening   Right eye Left eye Both eyes  Without correction     With correction 20/20-1 20/20 20/15  Recent  visit with new Rx. Glasses and CL's.   Dental - every 6 months, recent crown  Alcohol: 1 per week.   Tobacco: rare cigar after Golf - 2 times per year.   Exercise as above.   History Patient Active Problem List   Diagnosis Date Noted   Depression 10/12/2020   Severe obesity (BMI 35.0-35.9 with comorbidity) (HCC) 10/12/2020   Degenerative joint disease 10/12/2020   Insomnia 10/12/2020   Knee pain, chronic 10/12/2020   HTN (hypertension) 06/17/2014   Past Medical History:  Diagnosis Date   Allergy    seasonal    COVID-19 10/23/2019   no complications, no hospitalization   Hyperlipidemia    Hypertension    Past Surgical History:  Procedure Laterality Date   KNEE ARTHROSCOPY W/ MENISCECTOMY Left 07/13/2021   NO PAST SURGERIES     No Known Allergies Prior to Admission medications   Medication Sig Start Date End Date Taking? Authorizing Provider  allopurinol (ZYLOPRIM) 100 MG tablet Take 200 mg by mouth daily. 07/28/21  Yes [provider]  amLODipine (NORVASC) 5 MG tablet Take 1 tablet (5 mg total) by mouth daily. 05/18/21  Yes Shade Flood, MD  atorvastatin (LIPITOR) 20 MG tablet TAKE 1 TABLET BY MOUTH ONCE DAILY 6 IN THE EVENING 05/18/21  Yes Shade Flood, MD  lisinopril (ZESTRIL) 20 MG tablet Take 1 tablet by mouth once daily 10/13/21  Yes Shade Flood, MD  meloxicam (MOBIC) 15 MG tablet Take 15 mg by mouth daily. 09/15/20  Yes [provider]  Multiple Vitamin (MULTIVITAMIN WITH MINERALS) TABS tablet Take 1 tablet by mouth daily.   Yes [provider]  sertraline (ZOLOFT) 50 MG tablet Take by mouth. 09/25/20  Yes [provider]  sildenafil (VIAGRA) 100 MG tablet Take 0.5-1 tablets (50-100 mg total) by mouth daily as needed for erectile dysfunction. Max dose in 24 hour period is 100mg . 05/18/21  Yes 05/20/21, MD  traZODone (DESYREL) 50 MG tablet  09/25/20  Yes [provider]   Social History   Socioeconomic History   Marital status: Single    Spouse name: Not on file   Number of children: 3   Years of education: Not on file   Highest education level: Not on file  Occupational History   Not on file  Tobacco Use   Smoking status: Never   Smokeless tobacco: Never  Vaping Use   Vaping Use: Never used  Substance and Sexual Activity   Alcohol use: Yes    Alcohol/week: 2.0 standard drinks    Types: 1 Glasses of wine, 1 Cans of beer per week   Drug use: No   Sexual activity: Yes    Partners: Female    Comment: with multiple  partners, does not use condoms always  Other Topics Concern   Not on file  Social History Narrative   Pt is from [place], [place]. Has lived in [place] since 1998. Has three children. Lives at home alone. Works at [employer] at [employer].    Social Determinants of Health   Financial Resource Strain: Not on file  Food Insecurity: Not on file  Transportation Needs: Not on file  Physical Activity: Not on file  Stress: Not  on file  Social Connections: Not on file  Intimate Partner Violence: Not on file    Review of Systems  Constitutional:  Negative for fatigue and unexpected weight change.  Eyes:  Negative for visual disturbance.  Respiratory:  Negative for cough, chest tightness and shortness of breath.   Cardiovascular:  Negative for chest pain, palpitations and leg swelling.  Gastrointestinal:  Negative for abdominal pain and blood in stool.  Neurological:  Negative for dizziness, light-headedness and headaches.  13 point review of systems per patient health survey noted.  Negative other than as indicated above or in HPI.    Objective:   Vitals:   11/08/21 0802  BP: 120/76  Pulse: 66  Resp: 15  Temp: 98 F (36.7 C)  TempSrc: Temporal  SpO2: 98%  Weight: 242 lb 9.6 oz (110 kg)  Height: 5' 8.5" (1.74 m)     Physical Exam Vitals reviewed.  Constitutional:      Appearance: He is well-developed.  HENT:     Head: Normocephalic and atraumatic.     Right Ear: External ear normal.     Left Ear: External ear normal.  Eyes:     Conjunctiva/sclera: Conjunctivae normal.     Pupils: Pupils are equal, round, and reactive to light.  Neck:     Thyroid: No thyromegaly.     Vascular: No carotid bruit or JVD.  Cardiovascular:     Rate and Rhythm: Normal rate and regular rhythm.     Heart sounds: Normal heart sounds. No murmur heard. Pulmonary:     Effort: Pulmonary effort is normal. No respiratory distress.     Breath sounds: Normal breath sounds. No wheezing  or rales.  Abdominal:     General: There is no distension.     Palpations: Abdomen is soft.     Tenderness: There is no abdominal tenderness.  Musculoskeletal:        General: No tenderness. Normal range of motion.     Cervical back: Normal range of motion and neck supple.     Right lower leg: No edema.     Left lower leg: No edema.  Lymphadenopathy:     Cervical: No cervical adenopathy.  Skin:    General: Skin is warm and dry.  Neurological:     Mental Status: He is alert and oriented to person, place, and time.     Deep Tendon Reflexes: Reflexes are normal and symmetric.  Psychiatric:        Mood and Affect: Mood normal.        Behavior: Behavior normal.        Thought Content: Thought content normal.     Comments: Does not appear to be responding to internal stimuli, euthymic mood and affect mood congruent.        Assessment & Plan:  KUNAL LEVARIO is a 51 y.o. male . Annual physical exam  - -anticipatory guidance as below in AVS, screening labs above. Health maintenance items as above in HPI discussed/recommended as applicable.   Hyperlipidemia, unspecified hyperlipidemia type - Plan: atorvastatin (LIPITOR) 20 MG tablet  -  Stable, tolerating current regimen. Medications refilled. Labs pending as above. He will send me recent lab results from [place].   Essential hypertension - Plan: amLODipine (NORVASC) 5 MG tablet, lisinopril (ZESTRIL) 20 MG tablet  -  Stable, tolerating current regimen. Medications refilled. Labs pending as above.   Prediabetes  - recent labs pending. Increasing exercise, commended on diet.   Screening for deficiency anemia  Screening for prostate cancer  - as above - recent labs at [facility].   Need for influenza vaccination - Plan: Flu Vaccine QUAD 6+ mos PF IM (Fluarix Quad PF)  Depression Currently followed by GI, counseling.  Discussed suicidal ideation responses on depression screening as above.  Denies true suicidal ideation, intent or plan.   Option of higher dosing of Zoloft declined at this time. 988/suicide hotline precautions/ER precautions given.  Meds ordered this encounter  Medications   amLODipine (NORVASC) 5 MG tablet    Sig: Take 1 tablet (5 mg total) by mouth daily.    Dispense:  90 tablet    Refill:  1   atorvastatin (LIPITOR) 20 MG tablet    Sig: TAKE 1 TABLET BY MOUTH ONCE DAILY 6 IN THE EVENING    Dispense:  90 tablet    Refill:  1   lisinopril (ZESTRIL) 20 MG tablet    Sig: Take 1 tablet (20 mg total) by mouth daily.    Dispense:  90 tablet    Refill:  1   Patient Instructions  Send me a copy of recent labs to decide if others needed.   [facility] I would consider higher dose of sertraline if continued depression symptoms.  If you want to make a change, can take 2 of your current prescription and let me know how that is working.  I can send in the 100 mg prescription if that does better and you tolerate that dose.  Call 988, or suicide hotline if suicidal thoughts occur.   Thanks for coming in today.  Take care.   Preventive Care 50-54 Years Old, Male Preventive care refers to lifestyle choices and visits with your health care provider that can promote health and wellness. Preventive care visits are also called wellness exams. What can I expect for my preventive care visit? Counseling During your preventive care visit, your health care provider may ask about your: Medical history, including: Past medical problems. Family medical history. Current health, including: Emotional well-being. Home life and relationship well-being. Sexual activity. Lifestyle, including: Alcohol, nicotine or tobacco, and drug use. Access to firearms. Diet, exercise, and sleep habits. Safety issues such as seatbelt and bike helmet use. Sunscreen use. Work and work Astronomer. Physical exam Your health care provider will check your: Height and weight. These may be used to calculate your BMI (body  mass index). BMI is a measurement that tells if you are at a healthy weight. Waist circumference. This measures the distance around your waistline. This measurement also tells if you are at a healthy weight and may help predict your risk of certain diseases, such as type 2 diabetes and high blood pressure. Heart rate and blood pressure. Body temperature. Skin for abnormal spots. What immunizations do I need? Vaccines are usually given at various ages, according to a schedule. Your health care provider will recommend vaccines for you based on your age, medical history, and lifestyle or other factors, such as travel or where you work. What tests do I need? Screening Your health care provider may recommend screening tests for certain conditions. This may include: Lipid and cholesterol levels. Diabetes screening. This is done by checking your blood sugar (glucose) after you have not eaten for a while (fasting). Hepatitis B test. Hepatitis C test. HIV (human immunodeficiency virus) test. STI (sexually transmitted infection) testing, if you are at risk. Lung cancer screening. Prostate cancer screening. Colorectal cancer screening. Talk with your health care provider about your  test results, treatment options, and if necessary, the need for more tests. Follow these instructions at home: Eating and drinking  Eat a diet that includes fresh fruits and vegetables, whole grains, lean protein, and low-fat dairy products. Take vitamin and mineral supplements as recommended by your health care provider. Do not drink alcohol if your health care provider tells you not to drink. If you drink alcohol: Limit how much you have to 0-2 drinks a day. Know how much alcohol is in your drink. In the [place] one drink equals one 12 oz bottle of beer (355 mL), one 5 oz glass of wine (148 mL), or one 1 oz glass of hard liquor (44 mL). Lifestyle Brush your teeth every morning and night with fluoride toothpaste. Floss  one time each day. Exercise for at least 30 minutes 5 or more days each week. Do not use any products that contain nicotine or tobacco. These products include cigarettes, chewing tobacco, and vaping devices, such as e-cigarettes. If you need help quitting, ask your health care provider. Do not use drugs. If you are sexually active, practice safe sex. Use a condom or other form of protection to prevent STIs. Take aspirin only as told by your health care provider. Make sure that you understand how much to take and what form to take. Work with your health care provider to find out whether it is safe and beneficial for you to take aspirin daily. Find healthy ways to manage stress, such as: Meditation, yoga, or listening to music. Journaling. Talking to a trusted person. Spending time with friends and family. Minimize exposure to UV radiation to reduce your risk of skin cancer. Safety Always wear your seat belt while driving or riding in a vehicle. Do not drive: If you have been drinking alcohol. Do not ride with someone who has been drinking. When you are tired or distracted. While texting. If you have been using any mind-altering substances or drugs. Wear a helmet and other protective equipment during sports activities. If you have firearms in your house, make sure you follow all gun safety procedures. What's next? Go to your health care provider once a year for an annual wellness visit. Ask your health care provider how often you should have your eyes and teeth checked. Stay up to date on all vaccines. This information is not intended to replace advice given to you by your health care provider. Make sure you discuss any questions you have with your health care provider. Document Revised: 06/14/2021 Document Reviewed: 06/14/2021 Elsevier Patient Education  2022 [employer]     Signed,   Meredith Staggers, MD  Primary Care, [facility] 11/08/21 8:55 AM

## 2022-05-09 ENCOUNTER — Ambulatory Visit: Payer: BC Managed Care – PPO | Admitting: Family Medicine

## 2022-05-09 ENCOUNTER — Encounter: Payer: Self-pay | Admitting: Family Medicine

## 2022-05-09 VITALS — BP 124/74 | HR 54 | Temp 98.0°F | Resp 16 | Ht 68.0 in | Wt 239.0 lb

## 2022-05-09 DIAGNOSIS — F3289 Other specified depressive episodes: Secondary | ICD-10-CM

## 2022-05-09 DIAGNOSIS — M25561 Pain in right knee: Secondary | ICD-10-CM

## 2022-05-09 DIAGNOSIS — M109 Gout, unspecified: Secondary | ICD-10-CM

## 2022-05-09 DIAGNOSIS — R7303 Prediabetes: Secondary | ICD-10-CM | POA: Diagnosis not present

## 2022-05-09 DIAGNOSIS — E785 Hyperlipidemia, unspecified: Secondary | ICD-10-CM | POA: Diagnosis not present

## 2022-05-09 DIAGNOSIS — I10 Essential (primary) hypertension: Secondary | ICD-10-CM | POA: Diagnosis not present

## 2022-05-09 DIAGNOSIS — M5441 Lumbago with sciatica, right side: Secondary | ICD-10-CM

## 2022-05-09 LAB — COMPREHENSIVE METABOLIC PANEL
ALT: 23 U/L (ref 0–53)
AST: 21 U/L (ref 0–37)
Albumin: 4.6 g/dL (ref 3.5–5.2)
Alkaline Phosphatase: 107 U/L (ref 39–117)
BUN: 14 mg/dL (ref 6–23)
CO2: 31 mEq/L (ref 19–32)
Calcium: 9.9 mg/dL (ref 8.4–10.5)
Chloride: 104 mEq/L (ref 96–112)
Creatinine, Ser: 1.13 mg/dL (ref 0.40–1.50)
GFR: 74.28 mL/min (ref >60.00)
Glucose, Bld: 109 mg/dL — ABNORMAL HIGH (ref 70–99)
Potassium: 4.9 mEq/L (ref 3.5–5.1)
Sodium: 141 mEq/L (ref 135–145)
Total Bilirubin: 0.7 mg/dL (ref 0.2–1.2)
Total Protein: 7.5 g/dL (ref 6.0–8.3)

## 2022-05-09 LAB — LIPID PANEL
Cholesterol: 138 mg/dL (ref 0–200)
HDL: 37.7 mg/dL — ABNORMAL LOW (ref >39.00)
LDL Cholesterol: 86 mg/dL (ref 0–99)
NonHDL: 100.09
Total CHOL/HDL Ratio: 4
Triglycerides: 70 mg/dL (ref 0.0–149.0)
VLDL: 14 mg/dL (ref 0.0–40.0)

## 2022-05-09 LAB — URIC ACID: Uric Acid, Serum: 4.9 mg/dL (ref 4.0–7.8)

## 2022-05-09 LAB — HEMOGLOBIN A1C: Hgb A1c MFr Bld: 6.2 % (ref 4.6–6.5)

## 2022-05-09 MED ORDER — LISINOPRIL 20 MG PO TABS: 20.0000 mg | ORAL_TABLET | Freq: Every day | ORAL | 1 refills | Status: DC

## 2022-05-09 MED ORDER — ATORVASTATIN CALCIUM 20 MG PO TABS: ORAL_TABLET | ORAL | 1 refills | Status: DC

## 2022-05-09 MED ORDER — AMLODIPINE BESYLATE 5 MG PO TABS: 5.0000 mg | ORAL_TABLET | Freq: Every day | ORAL | 1 refills | Status: DC

## 2022-05-09 NOTE — Progress Notes (Signed)
? ?Subjective:  ?Patient ID: Hunter ColtChristopher L Adams, male    DOB: 11-14-1970  Age: 52 y.o. MRN: [id number] ? ?CC:  ?Chief Complaint  ?Patient presents with  ? Hyperlipidemia  ?  Pt has been doing well, no concerns, due for labs   ? Hypertension  ?  Pt has been well no physical sxs other than occasional headache  ? Prediabetes  ?  Due for recheck no concerns   ? Knee Pain  ?  Pt has had Lt knee replaced and think he may be due to the Rt notes [facility] but wanted to update PCP  ? Depression  ?  PHQ9=11 positive Suicidal ideation  ? ? ?HPI ?Hunter Adams presents for  ? ?Multiple concerns as above. ? ?Prediabetes: ?Diet, exercise approach, weight down a few pounds from November. ?Avoiding sugar beverages, fast food.  ?Exercise - walking 7115mi/week.  ?Lab Results  ?Component Value Date  ? HGBA1C 6.0 (A) 10/12/2020  ? ?Wt Readings from Last 3 Encounters:  ?05/09/22 239 lb (108.4 kg)  ?11/08/21 242 lb 9.6 oz (110 kg)  ?05/18/21 234 lb 12.8 oz (106.5 kg)  ? ?Hyperlipidemia: ?Lipitor 20 mg daily, no new side effects. ?Lab Results  ?Component Value Date  ? CHOL 140 10/12/2020  ? HDL 36 (L) 10/12/2020  ? LDLCALC 89 10/12/2020  ? TRIG 75 10/12/2020  ? CHOLHDL 3.9 10/12/2020  ? ?Lab Results  ?Component Value Date  ? ALT 31 10/12/2020  ? AST 28 10/12/2020  ? ALKPHOS 110 10/12/2020  ? BILITOT 0.6 10/12/2020  ? ?Hypertension: ?Amlodipine 5 mg daily, lisinopril 20 mg daily ?Home readings: none.  ?No new med side effects.  ?BP Readings from Last 3 Encounters:  ?05/09/22 124/74  ?11/08/21 120/76  ?05/18/21 116/78  ? ?Lab Results  ?Component Value Date  ? CREATININE 1.21 10/12/2020  ? ? ?Right knee pain ?History of left knee replacement.  Had [facility] with [facility] yesterday who is following him for this concern.  Scheduled for surgery in July. Takes aspirin. Helps some.  ? ?R low back pain: ?H[facility] L4-5 [facility]. ?2021 Pain in R back to R hip to R leg past few years. More frequent recently.no recent  injury.  [facility] with [facility] - deferred to PCP. ?No bowel or bladder incontinence, no saddle anesthesia, no lower extremity weakness.  ? ?Gout: ?Last flare: no recent flare.  ?Daily meds: Allopurinol 100 mg daily.  Recent diagnosis last year. ?Prn med: cochicine - used few times.  ?No results found for: LABURIC ? ?Depression: ?Treated with Zoloft 50 mg daily, trazodone 50 mg daily - as needed - 1-2/week.  ?[facility]  We discussed his suicidal ideation response at his November visit and denied any specific suicidal ideation, intent or plan at that time.  He does look back at life with some regrets in the past, prior suicide attempts.  Has called suicide hotline previously. ?Currently denies recent SI. Zoom meetings with therapist - helpful.  ? ? ?  05/09/2022  ?  8:50 AM 11/08/2021  ?  8:06 AM 05/18/2021  ?  9:00 AM 05/18/2021  ?  8:59 AM 10/12/2020  ?  9:04 AM  ?Depression screen PHQ 2/9  ?Decreased Interest 3 2 0 0 2  ?Down, Depressed, Hopeless 2 1 1 1 2   ?PHQ - 2 Score 5 3 1 1 4   ?Altered sleeping 1 1 2  2   ?Tired, decreased  energy 1 2 2  3   ?Change in appetite 1 1 1  2   ?Feeling bad or failure about yourself  1 2 2  3   ?Trouble concentrating 0 0 0  2  ?Moving slowly or fidgety/restless 1 1 0  2  ?Suicidal thoughts 1 1 0  2  ?PHQ-9 Score 11 11 8  20   ? ? ? ?History ?Patient Active Problem List  ? Diagnosis Date Noted  ? Depression 10/12/2020  ? Severe obesity (BMI 35.0-35.9 with comorbidity) (HCC) 10/12/2020  ? Degenerative joint disease 10/12/2020  ? Insomnia 10/12/2020  ? Knee pain, chronic 10/12/2020  ? HTN (hypertension) 06/17/2014  ? ?Past Medical History:  ?Diagnosis Date  ? Allergy   ? seasonal  ? COVID-19 10/23/2019  ? no complications, no hospitalization  ? Hyperlipidemia   ? Hypertension   ? ?Past Surgical History:  ?Procedure Laterality Date  ? KNEE ARTHROSCOPY W/ MENISCECTOMY Left 07/13/2021  ? NO PAST SURGERIES    ? ?No Known Allergies ?Prior to Admission medications    ?Medication Sig Start Date End Date Taking? Authorizing Provider  ?allopurinol (ZYLOPRIM) 100 MG tablet Take 200 mg by mouth daily. 07/28/21  Yes [provider]  ?amLODipine (NORVASC) 5 MG tablet Take 1 tablet (5 mg total) by mouth daily. 11/08/21  Yes 10/25/2019, MD  ?atorvastatin (LIPITOR) 20 MG tablet TAKE 1 TABLET BY MOUTH ONCE DAILY 6 IN THE EVENING 11/08/21  Yes 07/30/21, MD  ?lisinopril (ZESTRIL) 20 MG tablet Take 1 tablet (20 mg total) by mouth daily. 11/08/21  Yes Sh[place] Flood, MD  ?Multiple Vitamin (MULTIVITAMIN WITH MINERALS) TABS tablet Take 1 tablet by mouth daily.   Yes [provider]  ?sertraline (ZOLOFT) 50 MG tablet Take by mouth. 09/25/20  Yes [provider]  ?sildenafil (VIAGRA) 100 MG tablet Take 0.5-1 tablets (50-100 mg total) by mouth daily as needed for erectile dysfunction. Max dose in 24 hour period is 100mg . 05/18/21  Yes 13/9/22, MD  ?traZODone (DESYREL) 50 MG tablet  09/25/20  Yes [provider]  ?meloxicam (MOBIC) 15 MG tablet Take 15 mg by mouth daily. ?Patient not taking: Reported on 05/09/2022 09/15/20   [provider]  ? ?Social History  ? ?Socioeconomic History  ? Marital status: Single  ?  Spouse name: Not on file  ? Number of children: 3  ? Years of education: Not on file  ? Highest education level: Not on file  ?Occupational History  ? Not on file  ?Tobacco Use  ? Smoking status: Never  ? Smokeless tobacco: Never  ?Vaping Use  ? Vaping Use: Never used  ?Substance and Sexual Activity  ? Alcohol use: Yes  ?  Alcohol/week: 2.0 standard drinks  ?  Types: 1 Glasses of wine, 1 Cans of beer per week  ? Drug use: No  ? Sexual activity: Yes  ?  Partners: Female  ?  Comment: with multiple partners, does not use condoms always  ?Other Topics Concern  ? Not on file  ?Social History Narrative  ? Pt is from [place], [place]. Has lived in [place] since 1998. Has three children. Lives at home alone. Works at [employer] at [employer] and [place].   ? ?Social Determinants of Health  ? ?Financial Resource Strain: Not on file  ?Food Insecurity: Not on file  ?Transportation Needs: Not on file  ?Physical Activity: Not on file  ?Stress: Not on file  ?Social Connections: Not on  file  ?Intimate Partner Violence: Not on file  ? ? ?Review of Systems  ?Constitutional:  Negative for fatigue and unexpected weight change.  ?Eyes:  Negative for visual disturbance.  ?Respiratory:  Negative for cough, chest tightness and shortness of breath.   ?Cardiovascular:  Negative for chest pain, palpitations and leg swelling.  ?Gastrointestinal:  Negative for abdominal pain and blood in stool.  ?Neurological:  Positive for headaches (occasional). Negative for dizziness and light-headedness.  ? ? ?Objective:  ? ?Vitals:  ? 05/09/22 0845  ?BP: 124/74  ?Pulse: (!) 54  ?Resp: 16  ?Temp: 98 ?F (36.7 ?C)  ?TempSrc: Temporal  ?SpO2: 98%  ?Weight: 239 lb (108.4 kg)  ?Height: 5\' 8"  (1.727 m)  ? ? ? ?Physical Exam ?Vitals reviewed.  ?Constitutional:   ?   Appearance: Normal appearance. He is well-developed.  ?HENT:  ?   Head: Normocephalic and atraumatic.  ?Neck:  ?   Vascular: No carotid bruit or JVD.  ?Cardiovascular:  ?   Rate and Rhythm: Normal rate and regular rhythm.  ?   Heart sounds: Normal heart sounds. No murmur heard. ?Pulmonary:  ?   Effort: Pulmonary effort is normal.  ?   Breath sounds: Normal breath sounds. No rales.  ?Musculoskeletal:  ?   Right lower leg: No edema.  ?   Left lower leg: No edema.  ?   Comments: Lumbar spine, full range of motion, ambulating without difficulty or assistive device.  Negative seated straight leg raise (discomfort at medial knee only).  Some tenderness palpation over the lower lumbar spine mid to the right side approximately L4-5.  ?Skin: ?   General: Skin is warm and dry.  ?Neurological:  ?   Mental Status: He is alert and oriented to person, place, and time.  ?Psychiatric:     ?   Mood and Affect: Mood normal.   ? ? ? ? ? ?Assessment & Plan:  ?Hunter Adams is a 52 y.o. male . ?Prediabetes - Plan: Comprehensive metabolic panel, Hemoglobin A1c ? -Check updated labs.  Commended on watching sugars, fast food, diet control, activity/

## 2022-05-09 NOTE — Patient Instructions (Addendum)
Keep follow up with ortho for your knee pain. If concerns on labs I will let you know. No med changes.  ?I would consider trying a higher dose of sertraline, but can discuss with [facility] if you would like. Continue counseling. Call 988 or suicide hotline if any suicidal thoughts.  ?Xray of back at [facility], tylenol is ok for now, heat or ice and gentle stretching. Recheck after xray to decide next step. You can discuss physical [facility] with the [facility] again or we can discuss that at follow up as well.  ? ?[facility]  ?Walk in 8:30-4:30 during weekdays, no appointment needed ?[address]  ?[place], [place] [address] ? ? ? ?Acute Back Pain, Adult ?Acute back pain is sudden and usually short-lived. It is often caused by an injury to the muscles and tissues in the back. The injury may result from: ?A muscle, tendon, or ligament getting overstretched or torn. Ligaments are tissues that connect bones to each other. Lifting something improperly can cause a back strain. ?Wear and tear (degeneration) of the spinal disks. Spinal disks are circular tissue that provide cushioning between the bones of the spine (vertebrae). ?Twisting motions, such as while playing sports or doing yard work. ?A hit to the back. ?Arthritis. ?You may have a physical exam, lab tests, and imaging tests to find the cause of your pain. Acute back pain usually goes away with rest and home care. ?Follow these instructions at home: ?Managing pain, stiffness, and swelling ?Take over-the-counter and prescription medicines only as told by your health care provider. Treatment may include medicines for pain and inflammation that are taken by mouth or applied to the skin, or muscle relaxants. ?Your health care provider may recommend applying ice during the first 24-48 hours after your pain starts. To do this: ?Put ice in a plastic bag. ?Place a towel between your skin and the bag. ?Leave the ice on for 20 minutes, 2-3 times a day. ?Remove the ice if your skin turns  bright red. This is very important. If you cannot feel pain, heat, or cold, you have a greater risk of damage to the area. ?If directed, apply heat to the affected area as often as told by your health care provider. Use the heat source that your health care provider recommends, such as a moist heat pack or a heating pad. ?Place a towel between your skin and the heat source. ?Leave the heat on for 20-30 minutes. ?Remove the heat if your skin turns bright red. This is especially important if you are unable to feel pain, heat, or cold. You have a greater risk of getting burned. ?Activity ? ?Do not stay in bed. Staying in bed for more than 1-2 days can delay your recovery. ?Sit up and stand up straight. Avoid leaning forward when you sit or hunching over when you stand. ?If you work at a desk, sit close to it so you do not need to lean over. Keep your chin tucked in. Keep your neck drawn back, and keep your elbows bent at a 90-degree angle (right angle). ?Sit high and close to the steering wheel when you drive. Add lower back (lumbar) support to your car seat, if needed. ?Take short walks on even surfaces as soon as you are able. Try to increase the length of time you walk each day. ?Do not sit, drive, or stand in one place for more than 30 minutes at a time. Sitting or standing for long periods of time can  put stress on your back. ?Do not drive or use heavy machinery while taking prescription pain medicine. ?Use proper lifting techniques. When you bend and lift, use positions that put less stress on your back: ?Zion your knees. ?Keep the load close to your body. ?Avoid twisting. ?Exercise regularly as told by your health care provider. Exercising helps your back heal faster and helps prevent back injuries by keeping muscles strong and flexible. ?Work with a physical therapist to make a safe exercise program, as recommended by your health care provider. Do any exercises as told by your physical  therapist. ?Lifestyle ?Maintain a healthy weight. Extra weight puts stress on your back and makes it difficult to have good posture. ?Avoid activities or situations that make you feel anxious or stressed. Stress and anxiety increase muscle tension and can make back pain worse. Learn ways to manage anxiety and stress, such as through exercise. ?General instructions ?Sleep on a firm mattress in a comfortable position. Try lying on your side with your knees slightly bent. If you lie on your back, put a pillow under your knees. ?Keep your head and neck in a straight line with your spine (neutral position) when using electronic equipment like smartphones or pads. To do this: ?Raise your smartphone or pad to look at it instead of bending your head or neck to look down. ?Put the smartphone or pad at the level of your face while looking at the screen. ?Follow your treatment plan as told by your health care provider. This may include: ?Cognitive or behavioral therapy. ?Acupuncture or massage therapy. ?Meditation or yoga. ?Contact a health care provider if: ?You have pain that is not relieved with rest or medicine. ?You have increasing pain going down into your legs or buttocks. ?Your pain does not improve after 2 weeks. ?You have pain at night. ?You lose weight without trying. ?You have a fever or chills. ?You develop nausea or vomiting. ?You develop abdominal pain. ?Get help right away if: ?You develop new bowel or bladder control problems. ?You have unusual weakness or numbness in your arms or legs. ?You feel faint. ?These symptoms may represent a serious problem that is an emergency. Do not wait to see if the symptoms will go away. Get medical help right away. Call your local emergency services (911 in the [place] Do not drive yourself to the hospital. ?Summary ?Acute back pain is sudden and usually short-lived. ?Use proper lifting techniques. When you bend and lift, use positions that put less stress on your back. ?Take  over-the-counter and prescription medicines only as told by your health care provider, and apply heat or ice as told. ?This information is not intended to replace advice given to you by your health care provider. Make sure you discuss any questions you have with your health care provider. ?Document Revised: 03/10/2021 Document Reviewed: 03/10/2021 ?Elsevier Patient Education ? 2023 Elsevier Inc. ? ? ?

## 2022-05-25 ENCOUNTER — Encounter: Payer: Self-pay | Admitting: Family Medicine

## 2022-05-25 ENCOUNTER — Ambulatory Visit: Payer: BC Managed Care – PPO | Admitting: Family Medicine

## 2022-05-25 VITALS — BP 126/74 | HR 63 | Temp 98.1°F | Resp 16 | Ht 68.0 in | Wt 243.8 lb

## 2022-05-25 DIAGNOSIS — M5441 Lumbago with sciatica, right side: Secondary | ICD-10-CM | POA: Diagnosis not present

## 2022-05-25 MED ORDER — MELOXICAM 7.5 MG PO TABS: 7.5000 mg | ORAL_TABLET | Freq: Every day | ORAL | 0 refills | Status: DC

## 2022-05-25 NOTE — Progress Notes (Signed)
Subjective:  Patient ID: Hunter Colthristopher L Olson, male    DOB: 1970-12-03  Age: 52 y.o. MRN: [id number]  CC:  Chief Complaint  Patient presents with   Back Pain    Pt reports some sharper pains in his Rt leg to knee, pt reports overall about the same     HPI Hunter Adams presents for   Right low back pain, right knee pain Discussed at his May 10 visit.  [facility].  Sched[facility] for knee [facility] in July[facility].  In regards to back pain, history of degenerative disc disease, reported pain in his right back to right hip to right leg over the past few years with more frequent pain recently at his May 10 visit.  Denied recent injury.  Suspected degenerative disc disease with sciatica.  Updated imaging was ordered with neck step of possible PT versus Ortho eval. Has not had x-ray yet. Still sharp pains R low back to hip, thigh and knee - outside. Notes in morning, some loosenning when walking. Pain with sitting for awhile.  Plans on R knee surgery in August.  No bowel or bladder incontinence, no saddle anesthesia, no lower extremity weakness.  [facility], min change.   Tx: ibuprofen every other day.  No recent mobic. Unknown if worked better.  Lab Results  Component Value Date   CREATININE 1.13 05/09/2022      History Patient Active Problem List   Diagnosis Date Noted   Depression 10/12/2020   Severe obesity (BMI 35.0-35.9 with comorbidity) (HCC) 10/12/2020   Degenerative joint disease 10/12/2020   Insomnia 10/12/2020   Knee pain, chronic 10/12/2020   HTN (hypertension) 06/17/2014   Past Medical History:  Diagnosis Date   Allergy    seasonal   COVID-19 10/23/2019   no complications, no hospitalization   Hyperlipidemia    Hypertension    Past Surgical History:  Procedure Laterality Date   KNEE ARTHROSCOPY W/ MENISCECTOMY Left 07/13/2021   NO PAST SURGERIES     No Known  Allergies Prior to Admission medications   Medication Sig Start Date End Date Taking? Authorizing Provider  allopurinol (ZYLOPRIM) 100 MG tablet Take 200 mg by mouth daily. 07/28/21  Yes [provider]  amLODipine (NORVASC) 5 MG tablet Take 1 tablet (5 mg total) by mouth daily. 05/09/22  Yes [place] Flood[place]ene, Analaya Hoey R, MD  atorvastatin (LIPITOR) 20 MG tablet TAKE 1 TABLET BY MOUTH ONCE DAILY 6 IN THE EVENING 05/09/22  Yes [place] FloodGreene, Javious Hallisey R, MD  lisinopril (ZESTRIL) 20 MG tablet Take 1 tablet (20 mg total) by mouth daily. 05/09/22  Yes [place] FloodGreene, Shabree Tebbetts R, MD  Multiple Vitamin (MULTIVITAMIN WITH MINERALS) TABS tablet Take 1 tablet by mouth daily.   Yes [provider]  sertraline (ZOLOFT) 50 MG tablet Take by mouth. 09/25/20  Yes [provider]  sildenafil (VIAGRA) 100 MG tablet Take 0.5-1 tablets (50-100 mg total) by mouth daily as needed for erectile dysfunction. Max dose in 24 hour period is 100mg . 05/18/21  Yes [place] FloodGreene, Caylen Kuwahara R, MD  traZODone (DESYREL) 50 MG tablet  09/25/20  Yes [provider]  meloxicam (MOBIC) 15 MG tablet Take 15 mg by mouth daily. Patient not taking: Reported on 05/25/2022 09/15/20   [provider]   Social History   Socioeconomic History   Marital status: Single    Spouse name: Not on file   Number of children:  3   Years of education: Not on file   Highest education level: Not on file  Occupational History   Not on file  Tobacco Use   Smoking status: Never   Smokeless tobacco: Never  Vaping Use   Vaping Use: Never used  Substance and Sexual Activity   Alcohol use: Yes    Alcohol/week: 2.0 standard drinks    Types: 1 Glasses of wine, 1 Cans of beer per week   Drug use: No   Sexual activity: Yes    Partners: Female    Comment: with multiple partners, does not use condoms always  Other Topics Concern   Not on file  Social History Narrative   Pt is from [place], [place]. Has lived in [place] since 1998. Has three children.  Lives at home alone. Works at [employer] at [employer] and [employer].    Social Determinants of Health   Financial Resource Strain: Not on file  Food Insecurity: Not on file  Transportation Needs: Not on file  Physical Activity: Not on file  Stress: Not on file  Social Connections: Not on file  Intimate Partner Violence: Not on file    Review of Systems Per HPI.   Objective:   Vitals:   05/25/22 0838  BP: 126/74  Pulse: 63  Resp: 16  Temp: 98.1 F (36.7 C)  TempSrc: Temporal  SpO2: 97%  Weight: 243 lb 12.8 oz (110.6 kg)  Height: 5\' 8"  (1.727 m)     Physical Exam Constitutional:      General: He is not in acute distress.    Appearance: Normal appearance. He is well-developed.  HENT:     Head: Normocephalic and atraumatic.  Cardiovascular:     Rate and Rhythm: Normal rate.  Pulmonary:     Effort: Pulmonary effort is normal.  Musculoskeletal:     Comments: Lumbar spine: Slight tenderness over the right paraspinal muscles, lower right lumbar spine.  No midline bony tenderness.  Right-sided back pain with right rotation, flexion beyond 90 degrees, and left lateral flexion.  Able to heel and toe walk without difficulty or weakness.  Lower extremity strength equal bilaterally.  Discomfort with stretching sensation with right seated straight leg raise.  Neurological:     Mental Status: He is alert and oriented to person, place, and time.  Psychiatric:        Mood and Affect: Mood normal.       Assessment & Plan:  Hunter Adams is a 52 y.o. male . 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  No known injury.  Updated imaging ordered, encouraged to have that imaging performed.  We will start meloxicam low-dose for now, stop other NSAIDs.  Potential side effects and  risks discussed including with combination of SSRI.  Understanding expressed.  Refer to back specialist as minimal relief with PT previously.  RTC precautions  Meds ordered this encounter  Medications   meloxicam (MOBIC) 7.5 MG tablet    Sig: Take 1 tablet (7.5 mg total) by mouth daily.    Dispense:  30 tablet    Refill:  0   Patient Instructions  Please have x-ray performed so that we can see if there are any significant changes.  Try meloxicam once per day.  Avoid ibuprofen or other pain relievers.  Tylenol over-the-counter is okay if needed.  I will refer you to a back specialist to decide on the next step is physical therapy was not much help last year.  Let me know if there are any questions.  Take care.  Return to the clinic or go to the nearest emergency room if any of your symptoms worsen or new symptoms occur.  [facility] for xray Walk in 8:30-4:30 during weekdays, no appointment needed [address]  [place], [place] [address]     Signed,   Meredith Staggers, MD [facility], [facility] 05/25/22 9:26 AM

## 2022-05-25 NOTE — Patient Instructions (Signed)
Please have x-ray performed so that we can see if there are any significant changes.  Try meloxicam once per day.  Avoid ibuprofen or other pain relievers.  Tylenol over-the-counter is okay if needed.  I will refer you to a back specialist to decide on the next step is physical therapy was not much help last year.  Let me know if there are any questions.  Take care.  Return to the clinic or go to the nearest emergency room if any of your symptoms worsen or new symptoms occur.  [facility] for xray Walk in 8:30-4:30 during weekdays, no appointment needed [address]  [place], [place] [address]

## 2022-07-04 ENCOUNTER — Encounter: Payer: Self-pay | Admitting: Family Medicine

## 2022-07-04 ENCOUNTER — Telehealth: Payer: Self-pay

## 2022-07-04 NOTE — Telephone Encounter (Signed)
Spoke with pt he is going to contact [employer].  Message from [place] neuro:  Gunnar Fusi spoke with patient on 6/5 to schedule appt and patient stated he wanted to go through the [facility] instead of using his private insurance. Called patient today to see if he was able to reach anyone at the [facility]. Patient stated he had not called them yet but he still wanted to see if they will cover his referral. Patient also asked for a rough estimate of the financial cost if he decided to use his private insurance. I advised he would need to call the [facility] to request a referral to our office and I will check to see about prices using his private insurance. Patient expressed understanding and we will touch base on Friday to decide how he would like to proceed.

## 2022-07-04 NOTE — Telephone Encounter (Signed)
Pt called the office asking for you

## 2022-07-23 ENCOUNTER — Ambulatory Visit: Payer: BC Managed Care – PPO

## 2022-07-23 ENCOUNTER — Ambulatory Visit: Payer: BC Managed Care – PPO | Admitting: Podiatry

## 2022-07-23 ENCOUNTER — Ambulatory Visit (INDEPENDENT_AMBULATORY_CARE_PROVIDER_SITE_OTHER): Payer: BC Managed Care – PPO

## 2022-07-23 DIAGNOSIS — M2141 Flat foot [pes planus] (acquired), right foot: Secondary | ICD-10-CM

## 2022-07-23 DIAGNOSIS — M7662 Achilles tendinitis, left leg: Secondary | ICD-10-CM | POA: Diagnosis not present

## 2022-07-23 DIAGNOSIS — M76829 Posterior tibial tendinitis, unspecified leg: Secondary | ICD-10-CM | POA: Diagnosis not present

## 2022-07-23 DIAGNOSIS — M79671 Pain in right foot: Secondary | ICD-10-CM

## 2022-07-23 DIAGNOSIS — M7661 Achilles tendinitis, right leg: Secondary | ICD-10-CM

## 2022-07-23 DIAGNOSIS — M2142 Flat foot [pes planus] (acquired), left foot: Secondary | ICD-10-CM

## 2022-07-23 DIAGNOSIS — M79672 Pain in left foot: Secondary | ICD-10-CM | POA: Diagnosis not present

## 2022-07-23 DIAGNOSIS — M21611 Bunion of right foot: Secondary | ICD-10-CM

## 2022-07-23 DIAGNOSIS — M21612 Bunion of left foot: Secondary | ICD-10-CM

## 2022-07-23 NOTE — Progress Notes (Addendum)
Subjective:   Patient ID: Hunter Adams, male   DOB: 52 y.o.   MRN: [id number]   HPI Chief Complaint  Patient presents with   Foot Pain    Pt came for bilateral foot and ankle pain, pain arch to toes which is a sharp pain, left lateral side of the ankle and right medial side of the ankle, pain rate is 6 out of 10, TX: inserts,     Patient states that about 1.5 years ago and he got cork inserts that did not help and he states he could wear them in the shoes. He states this has been ongoing for years. He states that the right hurts than the left. No injuries. He gets pain along the midfoot to the toe and along the ankle on the right side on the inside and on the top of the right foot/ankle. On the left foot the middle of the foot to the oe on the bottom and on the outside of the left foot. No swelling.    Review of Systems  All other systems reviewed and are negative.  Past Medical History:  Diagnosis Date   Allergy    seasonal   COVID-19 10/23/2019   no complications, no hospitalization   Hyperlipidemia    Hypertension     Past Surgical History:  Procedure Laterality Date   KNEE ARTHROSCOPY W/ MENISCECTOMY Left 07/13/2021   NO PAST SURGERIES       Current Outpatient Medications:    allopurinol (ZYLOPRIM) 100 MG tablet, Take 200 mg by mouth daily., Disp: , Rfl:    amLODipine (NORVASC) 5 MG tablet, Take 1 tablet (5 mg total) by mouth daily., Disp: 90 tablet, Rfl: 1   atorvastatin (LIPITOR) 20 MG tablet, TAKE 1 TABLET BY MOUTH ONCE DAILY 6 IN THE EVENING, Disp: 90 tablet, Rfl: 1   lisinopril (ZESTRIL) 20 MG tablet, Take 1 tablet (20 mg total) by mouth daily., Disp: 90 tablet, Rfl: 1   Multiple Vitamin (MULTIVITAMIN WITH MINERALS) TABS tablet, Take 1 tablet by mouth daily., Disp: , Rfl:    sertraline (ZOLOFT) 50 MG tablet, Take by mouth., Disp: , Rfl:    sildenafil (VIAGRA) 100 MG tablet, Take 0.5-1 tablets (50-100 mg total) by mouth daily as needed for erectile dysfunction.  Max dose in 24 hour period is 100mg ., Disp: 5 tablet, Rfl: 6   traZODone (DESYREL) 50 MG tablet, , Disp: , Rfl:   No Known Allergies        Objective:  Physical Exam  General: AAO x3, NAD  Dermatological: Skin is warm, dry and supple bilateral. There are no open sores, no preulcerative lesions, no rash or signs of infection present.  Vascular: Dorsalis Pedis artery and Posterior Tibial artery pedal pulses are 2/4 bilateral with immedate capillary fill time.  There is no pain with calf compression, swelling, warmth, erythema.   Neruologic: Grossly intact via light touch bilateral. Negative tinel sign.   Musculoskeletal: Decreased medial arch upon weightbearing.  There is tenderness on the mid substance of the bilateral Achilles tendon.  No palpable defect is noted in the Achilles tendon appears to be intact.  There is mild visible cortical male seen on the left side.  Tenderness on the right posterior tibial tendon.  Clinically the tendons appear to be clinically intact.  No area pinpoint tenderness.  Generally there is no erythema or warmth.  MMT 5/5. Mild HAV present   Gait: Unassisted, Nonantalgic.       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 3 views of bilateral feet and 2 views of bilateral ankles obtained. Mild HAV present. Decreased calcaneal inclination angle.  -Overall we discussed using better arch support.  Also discussed bracing options.  We will start with over-the-counter inserts.  If needed we will do casting. -Stretching, icing daily.  We discussed doing some home versus physical therapy. -NSAIDs prn -Offered night splint to help stretch the Achilles but declined -Ice  Vivi Barrack DPM

## 2022-07-23 NOTE — Patient Instructions (Addendum)
If was nice to meet you today. If you have any questions or any further concerns, please feel fee to give me a call. You can call our office at [phone] or please feel fee to send me a message through MyChart.    For inserts I like Superfeet, Powersteps, Aetrex   For instructions on how to put on your Night Splint, please visit [email or web address]    Achilles Tendinitis  with Rehab Achilles tendinitis is a disorder of the Achilles tendon. The Achilles tendon connects the large calf muscles (Gastrocnemius and Soleus) to the heel bone (calcaneus). This tendon is sometimes called the heel cord. It is important for pushing-off and standing on your toes and is important for walking, running, or jumping. Tendinitis is often caused by overuse and repetitive microtrauma. SYMPTOMS Pain, tenderness, swelling, warmth, and redness may occur over the Achilles tendon even at rest. Pain with pushing off, or flexing or extending the ankle. Pain that is worsened after or during activity. CAUSES  Overuse sometimes seen with rapid increase in exercise programs or in sports requiring running and jumping. Poor physical conditioning (strength and flexibility or endurance). Running sports, especially training running down hills. Inadequate warm-up before practice or play or failure to stretch before participation. Injury to the tendon. PREVENTION  Warm up and stretch before practice or competition. Allow time for adequate rest and recovery between practices and competition. Keep up conditioning. Keep up ankle and leg flexibility. Improve or keep muscle strength and endurance. Improve cardiovascular fitness. Use proper technique. Use proper equipment (shoes, skates). To help prevent recurrence, taping, protective strapping, or an adhesive bandage may be recommended for several weeks after healing is complete. PROGNOSIS  Recovery may take weeks to several months to heal. Longer recovery is expected  if symptoms have been prolonged. Recovery is usually quicker if the inflammation is due to a direct blow as compared with overuse or sudden strain. RELATED COMPLICATIONS  Healing time will be prolonged if the condition is not correctly treated. The injury must be given plenty of time to heal. Symptoms can reoccur if activity is resumed too soon. Untreated, tendinitis may increase the risk of tendon rupture requiring additional time for recovery and possibly surgery. TREATMENT  The first treatment consists of rest anti-inflammatory medication, and ice to relieve the pain. Stretching and strengthening exercises after resolution of pain will likely help reduce the risk of recurrence. Referral to a physical therapist or athletic trainer for further evaluation and treatment may be helpful. A walking boot or cast may be recommended to rest the Achilles tendon. This can help break the cycle of inflammation and microtrauma. Arch supports (orthotics) may be prescribed or recommended by your caregiver as an adjunct to therapy and rest. Surgery to remove the inflamed tendon lining or degenerated tendon tissue is rarely necessary and has shown less than predictable results. MEDICATION  Nonsteroidal anti-inflammatory medications, such as aspirin and ibuprofen, may be used for pain and inflammation relief. Do not take within 7 days before surgery. Take these as directed by your caregiver. Contact your caregiver immediately if any bleeding, stomach upset, or signs of allergic reaction occur. Other minor pain relievers, such as acetaminophen, may also be used. Pain relievers may be prescribed as necessary by your caregiver. Do not take prescription pain medication for longer than 4 to 7 days. Use only as directed and only as much as you need. Cortisone injections are rarely indicated. Cortisone injections may weaken tendons and predispose to rupture. It is better to  give the condition more time to heal than to use  them. HEAT AND COLD Cold is used to relieve pain and reduce inflammation for acute and chronic Achilles tendinitis. Cold should be applied for 10 to 15 minutes every 2 to 3 hours for inflammation and pain and immediately after any activity that aggravates your symptoms. Use ice packs or an ice massage. Heat may be used before performing stretching and strengthening activities prescribed by your caregiver. Use a heat pack or a warm soak. SEEK MEDICAL CARE IF: Symptoms get worse or do not improve in 2 weeks despite treatment. New, unexplained symptoms develop. Drugs used in treatment may produce side effects.  EXERCISES:  RANGE OF MOTION (ROM) AND STRETCHING EXERCISES - Achilles Tendinitis  These exercises may help you when beginning to rehabilitate your injury. Your symptoms may resolve with or without further involvement from your physician, physical therapist or athletic trainer. While completing these exercises, remember:  Restoring tissue flexibility helps normal motion to return to the joints. This allows healthier, less painful movement and activity. An effective stretch should be held for at least 30 seconds. A stretch should never be painful. You should only feel a gentle lengthening or release in the stretched tissue.  STRETCH  Gastroc, Standing  Place hands on wall. Extend right / left leg, keeping the front knee somewhat bent. Slightly point your toes inward on your back foot. Keeping your right / left heel on the floor and your knee straight, shift your weight toward the wall, not allowing your back to arch. You should feel a gentle stretch in the right / left calf. Hold this position for 10 seconds. Repeat 3 times. Complete this stretch 2 times per day.  STRETCH  Soleus, Standing  Place hands on wall. Extend right / left leg, keeping the other knee somewhat bent. Slightly point your toes inward on your back foot. Keep your right / left heel on the floor, bend your back knee,  and slightly shift your weight over the back leg so that you feel a gentle stretch deep in your back calf. Hold this position for 10 seconds. Repeat 3 times. Complete this stretch 2 times per day.  STRETCH  Gastrocsoleus, Standing  Note: This exercise can place a lot of stress on your foot and ankle. Please complete this exercise only if specifically instructed by your caregiver.  Place the ball of your right / left foot on a step, keeping your other foot firmly on the same step. Hold on to the wall or a rail for balance. Slowly lift your other foot, allowing your body weight to press your heel down over the edge of the step. You should feel a stretch in your right / left calf. Hold this position for 10 seconds. Repeat this exercise with a slight bend in your knee. Repeat 3 times. Complete this stretch 2 times per day.   STRENGTHENING EXERCISES - Achilles Tendinitis These exercises may help you when beginning to rehabilitate your injury. They may resolve your symptoms with or without further involvement from your physician, physical therapist or athletic trainer. While completing these exercises, remember:  Muscles can gain both the endurance and the strength needed for everyday activities through controlled exercises. Complete these exercises as instructed by your physician, physical therapist or athletic trainer. Progress the resistance and repetitions only as guided. You may experience muscle soreness or fatigue, but the pain or discomfort you are trying to eliminate should never worsen during these exercises. If this pain  does worsen, stop and make certain you are following the directions exactly. If the pain is still present after adjustments, discontinue the exercise until you can discuss the trouble with your clinician.  STRENGTH - Plantar-flexors  Sit with your right / left leg extended. Holding onto both ends of a rubber exercise band/tubing, loop it around the ball of your foot. Keep a  slight tension in the band. Slowly push your toes away from you, pointing them downward. Hold this position for 10 seconds. Return slowly, controlling the tension in the band/tubing. Repeat 3 times. Complete this exercise 2 times per day.   STRENGTH - Plantar-flexors  Stand with your feet shoulder width apart. Steady yourself with a wall or table using as little support as needed. Keeping your weight evenly spread over the width of your feet, rise up on your toes.* Hold this position for 10 seconds. Repeat 3 times. Complete this exercise 2 times per day.  *If this is too easy, shift your weight toward your right / left leg until you feel challenged. Ultimately, you may be asked to do this exercise with your right / left foot only.  STRENGTH  Plantar-flexors, Eccentric  Note: This exercise can place a lot of stress on your foot and ankle. Please complete this exercise only if specifically instructed by your caregiver.  Place the balls of your feet on a step. With your hands, use only enough support from a wall or rail to keep your balance. Keep your knees straight and rise up on your toes. Slowly shift your weight entirely to your right / left toes and pick up your opposite foot. Gently and with controlled movement, lower your weight through your right / left foot so that your heel drops below the level of the step. You will feel a slight stretch in the back of your calf at the end position. Use the healthy leg to help rise up onto the balls of both feet, then lower weight only on the right / left leg again. Build up to 15 repetitions. Then progress to 3 consecutive sets of 15 repetitions.* After completing the above exercise, complete the same exercise with a slight knee bend (about 30 degrees). Again, build up to 15 repetitions. Then progress to 3 consecutive sets of 15 repetitions.* Perform this exercise 2 times per day.  *When you easily complete 3 sets of 15, your physician, physical  therapist or athletic trainer may advise you to add resistance by wearing a backpack filled with additional weight.  STRENGTH - Plantar Flexors, Seated  Sit on a chair that allows your feet to rest flat on the ground. If necessary, sit at the edge of the chair. Keeping your toes firmly on the ground, lift your right / left heel as far as you can without increasing any discomfort in your ankle. Repeat 3 times. Complete this exercise 2 times a day.

## 2022-07-30 ENCOUNTER — Other Ambulatory Visit: Payer: Self-pay | Admitting: Podiatry

## 2022-07-30 DIAGNOSIS — M7661 Achilles tendinitis, right leg: Secondary | ICD-10-CM

## 2022-08-08 ENCOUNTER — Other Ambulatory Visit: Payer: Self-pay | Admitting: Family Medicine

## 2022-08-08 DIAGNOSIS — N529 Male erectile dysfunction, unspecified: Secondary | ICD-10-CM

## 2022-08-13 HISTORY — PX: MENISCUS REPAIR: SHX5179

## 2022-08-16 ENCOUNTER — Telehealth: Payer: Self-pay | Admitting: Podiatry

## 2022-08-16 NOTE — Telephone Encounter (Signed)
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 Please advise?

## 2022-09-24 ENCOUNTER — Ambulatory Visit: Payer: BC Managed Care – PPO | Admitting: Podiatry

## 2022-09-24 DIAGNOSIS — M2142 Flat foot [pes planus] (acquired), left foot: Secondary | ICD-10-CM | POA: Diagnosis not present

## 2022-09-24 DIAGNOSIS — M76829 Posterior tibial tendinitis, unspecified leg: Secondary | ICD-10-CM | POA: Diagnosis not present

## 2022-09-24 DIAGNOSIS — M7661 Achilles tendinitis, right leg: Secondary | ICD-10-CM | POA: Diagnosis not present

## 2022-09-24 DIAGNOSIS — M2141 Flat foot [pes planus] (acquired), right foot: Secondary | ICD-10-CM | POA: Diagnosis not present

## 2022-09-24 DIAGNOSIS — M7662 Achilles tendinitis, left leg: Secondary | ICD-10-CM

## 2022-09-24 NOTE — Patient Instructions (Signed)

## 2022-09-24 NOTE — Progress Notes (Signed)
Subjective: Chief Complaint  Patient presents with   Foot Pain    Patient is doing the same as last visit, rate of pain 7 out of 10, Bilateral pain, hallux and heel pain     52 year old male presents above concerns.  He states he is doing about the same.  States his foot feet are still causing quite a bit of issues.  No recent injury or changes otherwise.   Objective: AAO x3, NAD DP/PT pulses palpable bilaterally, CRT less than 3 seconds There is tenderness to palpation stable in the area of the Achilles tendon but clinically the tendon appears to be intact.  Decreased medial arch upon weightbearing.  There is tenderness palpation on the right first MPJ and there is a bunion present.  No area pinpoint tenderness noted otherwise.  MMT 5/5. No pain with calf compression, swelling, warmth, erythema  Assessment: PTTD, Achilles tendinitis, capsulitis  Plan: -All treatment options discussed with the patient including all alternatives, risks, complications.  -We discussed different treatment options with conservative as well as surgical.  He can continue with conservative care for now.  Continue shoes and good arch support.  I think physical therapy may be beneficial given the tendinitis.  We will contact the [facility] for physical therapy. -Continue OTC Ibuprofen - offered prescription dose -Patient encouraged to call the office with any questions, concerns, change in symptoms.   Trula Slade DPM

## 2022-09-26 ENCOUNTER — Other Ambulatory Visit: Payer: Self-pay | Admitting: Podiatry

## 2022-09-26 DIAGNOSIS — M2142 Flat foot [pes planus] (acquired), left foot: Secondary | ICD-10-CM

## 2022-10-25 ENCOUNTER — Telehealth (HOSPITAL_BASED_OUTPATIENT_CLINIC_OR_DEPARTMENT_OTHER): Payer: Self-pay | Admitting: *Deleted

## 2022-10-25 ENCOUNTER — Encounter: Payer: Self-pay | Admitting: Family Medicine

## 2022-10-25 ENCOUNTER — Ambulatory Visit: Payer: BC Managed Care – PPO | Admitting: Family Medicine

## 2022-10-25 VITALS — BP 118/74 | HR 53 | Temp 98.4°F | Ht 68.0 in | Wt 240.4 lb

## 2022-10-25 DIAGNOSIS — I10 Essential (primary) hypertension: Secondary | ICD-10-CM

## 2022-10-25 DIAGNOSIS — E785 Hyperlipidemia, unspecified: Secondary | ICD-10-CM | POA: Diagnosis not present

## 2022-10-25 DIAGNOSIS — R7303 Prediabetes: Secondary | ICD-10-CM

## 2022-10-25 DIAGNOSIS — I8391 Asymptomatic varicose veins of right lower extremity: Secondary | ICD-10-CM

## 2022-10-25 DIAGNOSIS — M5441 Lumbago with sciatica, right side: Secondary | ICD-10-CM

## 2022-10-25 DIAGNOSIS — E782 Mixed hyperlipidemia: Secondary | ICD-10-CM | POA: Insufficient documentation

## 2022-10-25 DIAGNOSIS — F3289 Other specified depressive episodes: Secondary | ICD-10-CM

## 2022-10-25 DIAGNOSIS — Z23 Encounter for immunization: Secondary | ICD-10-CM | POA: Diagnosis not present

## 2022-10-25 DIAGNOSIS — N529 Male erectile dysfunction, unspecified: Secondary | ICD-10-CM

## 2022-10-25 DIAGNOSIS — M109 Gout, unspecified: Secondary | ICD-10-CM

## 2022-10-25 DIAGNOSIS — M7989 Other specified soft tissue disorders: Secondary | ICD-10-CM

## 2022-10-25 LAB — COMPREHENSIVE METABOLIC PANEL
ALT: 24 U/L (ref 0–53)
AST: 23 U/L (ref 0–37)
Albumin: 4.5 g/dL (ref 3.5–5.2)
Alkaline Phosphatase: 118 U/L — ABNORMAL HIGH (ref 39–117)
BUN: 12 mg/dL (ref 6–23)
CO2: 31 mEq/L (ref 19–32)
Calcium: 9.8 mg/dL (ref 8.4–10.5)
Chloride: 104 mEq/L (ref 96–112)
Creatinine, Ser: 0.99 mg/dL (ref 0.40–1.50)
GFR: 86.78 mL/min (ref >60.00)
Glucose, Bld: 98 mg/dL (ref 70–99)
Potassium: 4 mEq/L (ref 3.5–5.1)
Sodium: 141 mEq/L (ref 135–145)
Total Bilirubin: 0.3 mg/dL (ref 0.2–1.2)
Total Protein: 7.8 g/dL (ref 6.0–8.3)

## 2022-10-25 LAB — LIPID PANEL
Cholesterol: 133 mg/dL (ref 0–200)
HDL: 34 mg/dL — ABNORMAL LOW (ref >39.00)
LDL Cholesterol: 84 mg/dL (ref 0–99)
NonHDL: 99.49
Total CHOL/HDL Ratio: 4
Triglycerides: 77 mg/dL (ref 0.0–149.0)
VLDL: 15.4 mg/dL (ref 0.0–40.0)

## 2022-10-25 LAB — HEMOGLOBIN A1C: Hgb A1c MFr Bld: 6.6 % — ABNORMAL HIGH (ref 4.6–6.5)

## 2022-10-25 MED ORDER — AMLODIPINE BESYLATE 5 MG PO TABS: 5.0000 mg | ORAL_TABLET | Freq: Every day | ORAL | 1 refills | Status: DC

## 2022-10-25 MED ORDER — SILDENAFIL CITRATE 100 MG PO TABS: ORAL_TABLET | ORAL | 0 refills | Status: DC

## 2022-10-25 MED ORDER — LISINOPRIL 20 MG PO TABS: 20.0000 mg | ORAL_TABLET | Freq: Every day | ORAL | 1 refills | Status: DC

## 2022-10-25 MED ORDER — ATORVASTATIN CALCIUM 20 MG PO TABS: ORAL_TABLET | ORAL | 1 refills | Status: DC

## 2022-10-25 NOTE — Progress Notes (Signed)
Subjective:  Patient ID: Hunter Adams, male    DOB: 22-Apr-1970  Age: 52 y.o. MRN: [id number]  CC:  Chief Complaint  Patient presents with   Hyperlipidemia   Hypertension   Sciatica    Pt has a CD of his Xray   Depression    PHQ9  - 9    HPI MICHOEL KUNIN presents for   Hypertension: Amlodipine 5 mg daily, lisinopril 20 mg daily. Home readings: none BP Readings from Last 3 Encounters:  10/25/22 118/74  05/25/22 126/74  05/09/22 124/74   Lab Results  Component Value Date   CREATININE 1.13 05/09/2022   Hyperlipidemia: Lipitor 20 mg daily. No new myalgias/side effects.  Lab Results  Component Value Date   CHOL 138 05/09/2022   HDL 37.70 (L) 05/09/2022   LDLCALC 86 05/09/2022   TRIG 70.0 05/09/2022   CHOLHDL 4 05/09/2022   Lab Results  Component Value Date   ALT 23 05/09/2022   AST 21 05/09/2022   ALKPHOS 107 05/09/2022   BILITOT 0.7 05/09/2022    Prediabetes: Diet/exercise approach.  Weight down 3 pounds from May. Less exercise d/t knee surgery. Recently back to work.  Rare sugar beverage.  Lab Results  Component Value Date   HGBA1C 6.2 05/09/2022   Wt Readings from Last 3 Encounters:  10/25/22 240 lb 6.4 oz (109 kg)  05/25/22 243 lb 12.8 oz (110.6 kg)  05/09/22 239 lb (108.4 kg)   Gout: Last flare: none recent.  Daily meds: Allopurinol 100 mg daily Prn med: none.  Lab Results  Component Value Date   LABURIC 4.9 05/09/2022   Back pain Lumbar facet arthropathy, right and lumbar radiculopathy.  Appointment October ninth with [facility].  Dr. Marcello Moores.  Status post right knee surgery and flare of right sided sciatica.  Initially was evaluated back in July.  Treated with PT previously.  Dry needling as option. currently in PT, considering acupuncture vs dry needling.  Presents with MRI report from [facility] on 07/25/2022.  L1-2, no significant spinal canal or neural foraminal stenosis, same for  L2-3, L3-4.  L4-5 with disc bulge contributing to mild spinal canal stenosis, disc bulge and facet arthrosis contribute to moderate/severe left and moderate right neuroforaminal stenosis.  L5-S1 no significant spinal canal stenosis.  Facet arthrosis contributes to moderate bilateral neuroforaminal stenosis.  Impression of degenerative changes of the lower lumbar spine.  Improving some with PT and biofreeze.  Erectile dysfunction: Half pill Viagra has been effective, denies headache or flushing, make sure he is hydrated prior to taking meds.  Denies any vision or hearing changes or chest pain/dyspnea with exertion.  Depression: Treated with Zoloft 50 mg daily, trazodone 50 mg as needed - few nights per week.  Doing ok.  Followed by counselor at [facility].  Denies recent suicidal ideation.  Therapy had been helping when discussed in May.  We did discuss dosing changes of sertraline previously if needed. Ups and downs with depression. Declines change in dose of meds at this time. Still meeting with therapist.      10/25/2022    8:19 AM 05/09/2022    8:50 AM 11/08/2021    8:06 AM 05/18/2021    9:00 AM 05/18/2021    8:59 AM  Depression screen PHQ 2/9  Decreased Interest 1 3 2  0 0  Down, Depressed, Hopeless 1 2 1 1 1   PHQ - 2 Score 2 5 3 1 1   Altered sleeping 3 1  1 2   Tired, decreased energy 2 1 2 2    Change in appetite 1 1 1 1    Feeling bad or failure about yourself  1 1 2 2    Trouble concentrating 0 0 0 0   Moving slowly or fidgety/restless 0 1 1 0   Suicidal thoughts 0 1 1 0   PHQ-9 Score 9 11 11 8       History Patient Active Problem List   Diagnosis Date Noted   Mixed hyperlipidemia 10/25/2022   Depression 10/12/2020   Severe obesity (BMI 35.0-35.9 with comorbidity) (HCC) 10/12/2020   Degenerative joint disease 10/12/2020   Insomnia 10/12/2020   Knee pain, chronic 10/12/2020   HTN (hypertension) 06/17/2014   Past Medical History:  Diagnosis Date   Allergy    seasonal   COVID-19  10/23/2019   no complications, no hospitalization   Hyperlipidemia    Hypertension    Past Surgical History:  Procedure Laterality Date   KNEE ARTHROSCOPY W/ MENISCECTOMY Left 07/13/2021   NO PAST SURGERIES     No Known Allergies Prior to Admission medications   Medication Sig Start Date End Date Taking? Authorizing Provider  allopurinol (ZYLOPRIM) 100 MG tablet Take 200 mg by mouth daily. 07/28/21  Yes [provider]  allopurinol (ZYLOPRIM) 100 MG tablet Take 2 tablets by mouth daily. 08/30/22  Yes [provider]  amLODipine (NORVASC) 5 MG tablet Take 1 tablet (5 mg total) by mouth daily. 05/09/22  Yes 07/15/2021, MD  amLODipine (NORVASC) 5 MG tablet Take 1 tablet by mouth daily. 05/23/20  Yes [provider]  atorvastatin (LIPITOR) 20 MG tablet TAKE 1 TABLET BY MOUTH ONCE DAILY 6 IN THE EVENING 05/09/22  Yes 07/09/22, MD  atorvastatin (LIPITOR) 20 MG tablet Take 1 tablet by mouth at bedtime. 04/09/22  Yes [provider]  lisinopril (ZESTRIL) 20 MG tablet Take 1 tablet (20 mg total) by mouth daily. 05/09/22  Yes 07/09/22, MD  lisinopril (ZESTRIL) 20 MG tablet Take 1 tablet by mouth daily. 05/23/20  Yes [provider]  Multiple Vitamin (MULTIVITAMIN WITH MINERALS) TABS tablet Take 1 tablet by mouth daily.   Yes [provider]  sertraline (ZOLOFT) 50 MG tablet Take by mouth. 09/25/20  Yes [provider]  sildenafil (VIAGRA) 100 MG tablet TAKE 1/2 TO 1 (ONE-HALF TO ONE) TABLET BY MOUTH DAILY AS NEEDED FOR ERECTILE DYSFUNCTION --  MAXIMUM  OF  100  MG  PER  24  HOUR  PERIOD 08/09/22  Yes Shade Flood, MD  traZODone (DESYREL) 50 MG tablet  09/25/20  Yes [provider]  sertraline (ZOLOFT) 50 MG tablet Take by mouth.    [provider]  sildenafil (VIAGRA) 100 MG tablet Take by mouth.    [provider]  traZODone (DESYREL) 50 MG tablet Take by mouth.    [provider]    Social History   Socioeconomic History   Marital status: Single    Spouse name: Not on file   Number of children: 3   Years of education: Not on file   Highest education level: Not on file  Occupational History   Not on file  Tobacco Use   Smoking status: Never   Smokeless tobacco: Never  Vaping Use   Vaping Use: Never used  Substance and Sexual Activity   Alcohol use: Yes    Alcohol/week: 2.0 standard drinks of alcohol    Types: 1 Glasses of wine, 1  Cans of beer per week   Drug use: No   Sexual activity: Yes    Partners: Female    Comment: with multiple partners, does not use condoms always  Other Topics Concern   Not on file  Social History Narrative   Pt is from [place], [place]. Has lived in [place] since 1998. Has three children. Lives at home alone. Works at [employer] at [employer].    Social Determinants of Health   Financial Resource Strain: Not on file  Food Insecurity: Not on file  Transportation Needs: Not on file  Physical Activity: Insufficiently Active (02/20/2018)   Exercise Vital Sign    Days of Exercise per Week: 3 days    Minutes of Exercise per Session: 30 min  Stress: No Stress Concern Present (02/20/2018)   [employer] of Occupational Health - Occupational Stress Questionnaire    Feeling of Stress : Not at all  Social Connections: Moderately Integrated (02/20/2018)   Social Connection and Isolation Panel [NHANES]    Frequency of Communication with Friends and Family: Twice a week    Frequency of Social Gatherings with Friends and Family: Once a week    Attends Religious Services: More than 4 times per year    Active Member of [employer] or Organizations: Yes    Attends Banker Meetings: More than 4 times per year    Marital Status: Never married  Intimate Partner Violence: Not At Risk (02/20/2018)   Humiliation, Afraid, Rape, and Kick questionnaire    Fear of Current or Ex-Partner: No    Emotionally Abused: No     Physically Abused: No    Sexually Abused: No    Review of Systems  Constitutional:  Negative for fatigue and unexpected weight change.  Eyes:  Negative for visual disturbance.  Respiratory:  Negative for cough, chest tightness and shortness of breath.   Cardiovascular:  Negative for chest pain, palpitations and leg swelling.  Gastrointestinal:  Negative for abdominal pain and blood in stool.  Neurological:  Negative for dizziness, light-headedness and headaches.     Objective:   Vitals:   10/25/22 0824  BP: 118/74  Pulse: (!) 53  Temp: 98.4 F (36.9 C)  SpO2: 98%  Weight: 240 lb 6.4 oz (109 kg)  Height: 5\' 8"  (1.727 m)     Physical Exam Vitals reviewed.  Constitutional:      Appearance: He is well-developed.  HENT:     Head: Normocephalic and atraumatic.  Neck:     Vascular: No carotid bruit or JVD.  Cardiovascular:     Rate and Rhythm: Normal rate and regular rhythm.     Heart sounds: Normal heart sounds. No murmur heard. Pulmonary:     Effort: Pulmonary effort is normal.     Breath sounds: Normal breath sounds. No rales.  Musculoskeletal:     Right lower leg: No edema.     Left lower leg: No edema.     Comments: Right calf, superficial varicosities noted medially with prominent/enlarged right calf versus left.  Negative Homans, no cords, nontender.  Skin:    General: Skin is warm and dry.  Neurological:     Mental Status: He is alert and oriented to person, place, and time.  Psychiatric:        Mood and Affect: Mood normal.        Behavior: Behavior normal.     Comments: Somewhat flat affect, but appropriate responses, good eye contact.  Does not appear  to be responding to internal stimuli and no suicidal ideation.        Assessment & Plan:  YADIR ZENTNER is a 52 y.o. male . Essential hypertension - Plan: amLODipine (NORVASC) 5 MG tablet, lisinopril (ZESTRIL) 20 MG tablet    Stable, tolerating current regimen. Medications refilled. Labs pending as  above.   Need for influenza vaccination - Plan: Flu Vaccine QUAD 6+ mos PF IM (Fluarix Quad PF)  Hyperlipidemia, unspecified hyperlipidemia type - Plan: atorvastatin (LIPITOR) 20 MG tablet, Comprehensive metabolic panel, Lipid panel  -  Stable, tolerating current regimen. Medications refilled. Labs pending as above.   Erectile dysfunction, unspecified erectile dysfunction type - Plan: sildenafil (VIAGRA) 100 MG tablet  - viagra Rx given - use lowest effective dose. Side effects discussed (including but not limited to headache/flushing, blue discoloration of vision, possible vascular steal and risk of cardiac effects if underlying unknown coronary artery disease, and permanent sensorineural hearing loss). Understanding expressed.  Prediabetes - Plan: Hemoglobin A1c  -Weight has improved, check A1c.  Continue diet/exercise approach  Gout, unspecified cause, unspecified chronicity, unspecified site  -Continue allopurinol, denies recent flares.  RTC precautions.  Other depression  -Fair treatment as above.  Option of higher dose of sertraline, no med changes for now.  If continued/chronic back pain, could consider SNRI.  Right-sided low back pain with right-sided sciatica, unspecified chronicity  -Some improvement with PT, Biofreeze, continue follow-up with PT and neurosurgeon.  Calf swelling - Plan: VAS [place] LOWER EXTREMITY VENOUS (DVT) Varicose veins of right lower extremity, unspecified whether complicated - Plan: VAS [place] LOWER EXTREMITY VENOUS (DVT)  -Nontender, negative Homans, unlikely DVT.  Check ultrasound.  Handout given on varicose veins.  Not painful or symptomatic at this time, no intervention needed at this time.  Meds ordered this encounter  Medications   amLODipine (NORVASC) 5 MG tablet    Sig: Take 1 tablet (5 mg total) by mouth daily.    Dispense:  90 tablet    Refill:  1   atorvastatin (LIPITOR) 20 MG tablet    Sig: TAKE 1 TABLET BY MOUTH ONCE DAILY 6 IN THE EVENING     Dispense:  90 tablet    Refill:  1   lisinopril (ZESTRIL) 20 MG tablet    Sig: Take 1 tablet (20 mg total) by mouth daily.    Dispense:  90 tablet    Refill:  1   sildenafil (VIAGRA) 100 MG tablet    Sig: TAKE 1/2 TO 1 (ONE-HALF TO ONE) TABLET BY MOUTH DAILY AS NEEDED FOR ERECTILE DYSFUNCTION --  MAXIMUM  OF  100  MG  PER  24  HOUR  PERIOD    Dispense:  5 tablet    Refill:  0   Patient Instructions  I will order ultrasound of your right calf but appears to be varicose veins.  If any pain or acute change in swelling, be seen.  No change in other medications for now.  As we discussed you do have the option of increasing your sertraline to 100 and rams per day, or 2 of the 50 mg pills if you have increased depression symptoms.  If you do make that change, let me know and I will send in the new dose.  Continue follow-up with therapist.  Also follow-up with your back specialist, continue physical therapy for back pain.  Continue to discuss other treatments if needed with them if your symptoms continue.  If any concerns on labs I will let  you know.  If there are other concerns we were not able to address today, please schedule an appointment to discuss further.  Thank you for coming in today and hang in there.  Varicose Veins Varicose veins are veins that have become enlarged, bulged, and twisted. They most often appear in the legs. What are the causes? This condition is caused by damage to the valves in the vein. These valves help blood return to your heart. When they are damaged and they stop working properly, blood may flow backward and back up in the veins near the skin, causing the veins to get larger and appear twisted. The condition can result from any issue that causes blood to back up, like pregnancy, prolonged standing, or obesity. What increases the risk? The following factors may make you more likely to develop this condition: Being on your feet a lot. Being pregnant. Being  overweight. Smoking. Having had a previous deep vein thrombosis or having a thrombotic disorder. Aging. The risk increases with age. Having a condition called Klippel-Trenaunay syndrome. What are the signs or symptoms? Symptoms of this condition include: Bulging, twisted, and bluish veins. A feeling of heaviness in your legs. This may be worse at the end of the day. Leg pain. This may be worse at the end of the day. Swelling in the leg. Changes in skin color over the veins. Swelling or pain in the legs can limit your activities. Your symptoms may get worse when you sit or stand for long periods of time. How is this diagnosed? This condition may be diagnosed based on: Your symptoms, family history, activity levels, and lifestyle. A physical exam. You may also have tests, including an ultrasound or X-ray. How is this treated? Treatment for this condition may involve: Avoiding sitting or standing in one position for long periods of time. Wearing compression stockings. These stockings help to prevent blood clots and reduce swelling in the legs. Raising (elevating) the legs when resting. Losing weight. Exercising regularly. If you have persistent symptoms or want to improve the way your varicose veins look, you may choose to have a procedure to close the varicose veins off or to remove them. Nonsurgical treatments to close off the veins include: Sclerotherapy. In this treatment, a solution is injected into a vein to close it off. Laser treatment. The vein is heated with a laser to close it off. Radiofrequency vein ablation. An electrical current produced by radio waves is used to close off the vein. Surgical treatments to remove the veins include: Phlebectomy. In this procedure, the veins are removed through small incisions made over the veins. Vein ligation and stripping. In this procedure, incisions are made over the veins. The veins are then removed after being tied (ligated) with  stitches (sutures). Follow these instructions at home: Medicines Take over-the-counter and prescription medicines only as told by your health care provider. If you were prescribed an antibiotic medicine, use it as told by your health care provider. Do not stop using the antibiotic even if you start to feel better. Activity Walk as much as possible. Walking increases blood flow. This helps blood return to the heart and takes pressure off your veins. Do not stand or sit in one position for a long period of time. Do not sit with your legs crossed. Avoid sitting for a long time without moving. Get up to take short walks every 1-2 hours. This is important to improve blood flow and breathing. Ask for help if you feel weak or  unsteady. Return to your normal activities as told by your health care provider. Ask your health care provider what activities are safe for you. Do exercises as told by your health care provider. General instructions  Follow any diet instructions given to you by your health care provider. Elevate your legs at night to above the level of your heart. If you get a cut in the skin over the varicose vein and the vein bleeds: Lie down with your leg raised. Apply firm pressure to the cut with a clean cloth until the bleeding stops. Place a bandage (dressing) on the cut. Drink enough fluid to keep your urine pale yellow. Do not use any products that contain nicotine or tobacco. These products include cigarettes, chewing tobacco, and vaping devices, such as e-cigarettes. If you need help quitting, ask your health care provider. Wear compression stockings as told by your health care provider. Do not wear other kinds of tight clothing around your legs, pelvis, or waist. Keep all follow-up visits. This is important. Contact a health care provider if: The skin around your varicose veins starts to break down. You have more pain, redness, tenderness, or hard swelling over a vein. You are  uncomfortable because of pain. You get a cut in the skin over a varicose vein and it will not stop bleeding. Get help right away if: You have chest pain. You have trouble breathing. You have severe leg pain. Summary Varicose veins are veins that have become enlarged, bulged, and twisted. They most often appear in the legs. This condition is caused by damage to the valves in the vein. These valves help blood return to your heart. Treatment for this condition includes frequent movements, wearing compression stockings, losing weight, and exercising regularly. In some cases, procedures are done to close off or remove the veins. Nonsurgical treatments to close off the veins include sclerotherapy, laser therapy, and radiofrequency vein ablation. This information is not intended to replace advice given to you by your health care provider. Make sure you discuss any questions you have with your health care provider. Document Revised: 05/31/2021 Document Reviewed: 05/31/2021 Elsevier Patient Education  2023 Elsevier Inc.     Signed,   Meredith StaggersJeffrey Yaslene Lindamood, MD  Primary Care, [facility] 10/25/22 8:59 AM

## 2022-10-25 NOTE — Telephone Encounter (Signed)
Left  message for patient to call and schedule the lower extremity venous doppler ordered by Dr. Merri Ray

## 2022-10-25 NOTE — Patient Instructions (Addendum)
I will order ultrasound of your right calf but appears to be varicose veins.  If any pain or acute change in swelling, be seen.  No change in other medications for now.  As we discussed you do have the option of increasing your sertraline to 100 and rams per day, or 2 of the 50 mg pills if you have increased depression symptoms.  If you do make that change, let me know and I will send in the new dose.  Continue follow-up with therapist.  Also follow-up with your back specialist, continue physical therapy for back pain.  Continue to discuss other treatments if needed with them if your symptoms continue.  If any concerns on labs I will let you know.  If there are other concerns we were not able to address today, please schedule an appointment to discuss further.  Thank you for coming in today and hang in there.  Varicose Veins Varicose veins are veins that have become enlarged, bulged, and twisted. They most often appear in the legs. What are the causes? This condition is caused by damage to the valves in the vein. These valves help blood return to your heart. When they are damaged and they stop working properly, blood may flow backward and back up in the veins near the skin, causing the veins to get larger and appear twisted. The condition can result from any issue that causes blood to back up, like pregnancy, prolonged standing, or obesity. What increases the risk? The following factors may make you more likely to develop this condition: Being on your feet a lot. Being pregnant. Being overweight. Smoking. Having had a previous deep vein thrombosis or having a thrombotic disorder. Aging. The risk increases with age. Having a condition called Klippel-Trenaunay syndrome. What are the signs or symptoms? Symptoms of this condition include: Bulging, twisted, and bluish veins. A feeling of heaviness in your legs. This may be worse at the end of the day. Leg pain. This may be worse at the end of the  day. Swelling in the leg. Changes in skin color over the veins. Swelling or pain in the legs can limit your activities. Your symptoms may get worse when you sit or stand for long periods of time. How is this diagnosed? This condition may be diagnosed based on: Your symptoms, family history, activity levels, and lifestyle. A physical exam. You may also have tests, including an ultrasound or X-ray. How is this treated? Treatment for this condition may involve: Avoiding sitting or standing in one position for long periods of time. Wearing compression stockings. These stockings help to prevent blood clots and reduce swelling in the legs. Raising (elevating) the legs when resting. Losing weight. Exercising regularly. If you have persistent symptoms or want to improve the way your varicose veins look, you may choose to have a procedure to close the varicose veins off or to remove them. Nonsurgical treatments to close off the veins include: Sclerotherapy. In this treatment, a solution is injected into a vein to close it off. Laser treatment. The vein is heated with a laser to close it off. Radiofrequency vein ablation. An electrical current produced by radio waves is used to close off the vein. Surgical treatments to remove the veins include: Phlebectomy. In this procedure, the veins are removed through small incisions made over the veins. Vein ligation and stripping. In this procedure, incisions are made over the veins. The veins are then removed after being tied (ligated) with stitches (sutures). Follow these instructions  at home: Medicines Take over-the-counter and prescription medicines only as told by your health care provider. If you were prescribed an antibiotic medicine, use it as told by your health care provider. Do not stop using the antibiotic even if you start to feel better. Activity Walk as much as possible. Walking increases blood flow. This helps blood return to the heart and  takes pressure off your veins. Do not stand or sit in one position for a long period of time. Do not sit with your legs crossed. Avoid sitting for a long time without moving. Get up to take short walks every 1-2 hours. This is important to improve blood flow and breathing. Ask for help if you feel weak or unsteady. Return to your normal activities as told by your health care provider. Ask your health care provider what activities are safe for you. Do exercises as told by your health care provider. General instructions  Follow any diet instructions given to you by your health care provider. Elevate your legs at night to above the level of your heart. If you get a cut in the skin over the varicose vein and the vein bleeds: Lie down with your leg raised. Apply firm pressure to the cut with a clean cloth until the bleeding stops. Place a bandage (dressing) on the cut. Drink enough fluid to keep your urine pale yellow. Do not use any products that contain nicotine or tobacco. These products include cigarettes, chewing tobacco, and vaping devices, such as e-cigarettes. If you need help quitting, ask your health care provider. Wear compression stockings as told by your health care provider. Do not wear other kinds of tight clothing around your legs, pelvis, or waist. Keep all follow-up visits. This is important. Contact a health care provider if: The skin around your varicose veins starts to break down. You have more pain, redness, tenderness, or hard swelling over a vein. You are uncomfortable because of pain. You get a cut in the skin over a varicose vein and it will not stop bleeding. Get help right away if: You have chest pain. You have trouble breathing. You have severe leg pain. Summary Varicose veins are veins that have become enlarged, bulged, and twisted. They most often appear in the legs. This condition is caused by damage to the valves in the vein. These valves help blood return to  your heart. Treatment for this condition includes frequent movements, wearing compression stockings, losing weight, and exercising regularly. In some cases, procedures are done to close off or remove the veins. Nonsurgical treatments to close off the veins include sclerotherapy, laser therapy, and radiofrequency vein ablation. This information is not intended to replace advice given to you by your health care provider. Make sure you discuss any questions you have with your health care provider. Document Revised: 05/31/2021 Document Reviewed: 05/31/2021 Elsevier Patient Education  2023 [employer].

## 2023-05-01 ENCOUNTER — Ambulatory Visit (INDEPENDENT_AMBULATORY_CARE_PROVIDER_SITE_OTHER): Payer: BC Managed Care – PPO | Admitting: Family Medicine

## 2023-05-01 ENCOUNTER — Encounter: Payer: Self-pay | Admitting: Family Medicine

## 2023-05-01 VITALS — BP 128/70 | HR 57 | Temp 97.8°F | Ht 68.75 in | Wt 237.2 lb

## 2023-05-01 DIAGNOSIS — Z0000 Encounter for general adult medical examination without abnormal findings: Secondary | ICD-10-CM

## 2023-05-01 DIAGNOSIS — Z130 Encounter for screening for diseases of the blood and blood-forming organs and certain disorders involving the immune mechanism: Secondary | ICD-10-CM

## 2023-05-01 DIAGNOSIS — I10 Essential (primary) hypertension: Secondary | ICD-10-CM | POA: Diagnosis not present

## 2023-05-01 DIAGNOSIS — E785 Hyperlipidemia, unspecified: Secondary | ICD-10-CM

## 2023-05-01 DIAGNOSIS — Z125 Encounter for screening for malignant neoplasm of prostate: Secondary | ICD-10-CM

## 2023-05-01 DIAGNOSIS — R7303 Prediabetes: Secondary | ICD-10-CM

## 2023-05-01 DIAGNOSIS — F3289 Other specified depressive episodes: Secondary | ICD-10-CM

## 2023-05-01 DIAGNOSIS — N529 Male erectile dysfunction, unspecified: Secondary | ICD-10-CM

## 2023-05-01 DIAGNOSIS — Z1329 Encounter for screening for other suspected endocrine disorder: Secondary | ICD-10-CM

## 2023-05-01 DIAGNOSIS — Z1159 Encounter for screening for other viral diseases: Secondary | ICD-10-CM

## 2023-05-01 LAB — COMPREHENSIVE METABOLIC PANEL
ALT: 18 U/L (ref 0–53)
AST: 18 U/L (ref 0–37)
Albumin: 4.4 g/dL (ref 3.5–5.2)
Alkaline Phosphatase: 102 U/L (ref 39–117)
BUN: 13 mg/dL (ref 6–23)
CO2: 30 mEq/L (ref 19–32)
Calcium: 9.5 mg/dL (ref 8.4–10.5)
Chloride: 103 mEq/L (ref 96–112)
Creatinine, Ser: 1.15 mg/dL (ref 0.40–1.50)
GFR: 72.24 mL/min (ref >60.00)
Glucose, Bld: 99 mg/dL (ref 70–99)
Potassium: 4.9 mEq/L (ref 3.5–5.1)
Sodium: 141 mEq/L (ref 135–145)
Total Bilirubin: 0.7 mg/dL (ref 0.2–1.2)
Total Protein: 7.3 g/dL (ref 6.0–8.3)

## 2023-05-01 LAB — CBC WITH DIFFERENTIAL/PLATELET
Basophils Absolute: 0 10*3/uL (ref 0.0–0.1)
Basophils Relative: 0.3 % (ref 0.0–3.0)
Eosinophils Absolute: 0.1 10*3/uL (ref 0.0–0.7)
Eosinophils Relative: 1.1 % (ref 0.0–5.0)
HCT: 42.1 % (ref 39.0–52.0)
Hemoglobin: 13.8 g/dL (ref 13.0–17.0)
Lymphocytes Relative: 30.4 % (ref 12.0–46.0)
Lymphs Abs: 1.8 10*3/uL (ref 0.7–4.0)
MCHC: 32.8 g/dL (ref 30.0–36.0)
MCV: 87.6 fl (ref 78.0–100.0)
Monocytes Absolute: 0.6 10*3/uL (ref 0.1–1.0)
Monocytes Relative: 11 % (ref 3.0–12.0)
Neutro Abs: 3.3 10*3/uL (ref 1.4–7.7)
Neutrophils Relative %: 57.2 % (ref 43.0–77.0)
Platelets: 185 10*3/uL (ref 150.0–400.0)
RBC: 4.81 Mil/uL (ref 4.22–5.81)
RDW: 15.1 % (ref 11.5–15.5)
WBC: 5.8 10*3/uL (ref 4.0–10.5)

## 2023-05-01 LAB — LIPID PANEL
Cholesterol: 126 mg/dL (ref 0–200)
HDL: 36.1 mg/dL — ABNORMAL LOW (ref >39.00)
LDL Cholesterol: 76 mg/dL (ref 0–99)
NonHDL: 90.33
Total CHOL/HDL Ratio: 4
Triglycerides: 72 mg/dL (ref 0.0–149.0)
VLDL: 14.4 mg/dL (ref 0.0–40.0)

## 2023-05-01 LAB — PSA: PSA: 3.65 ng/mL (ref 0.10–4.00)

## 2023-05-01 LAB — TSH: TSH: 1.02 u[IU]/mL (ref 0.35–5.50)

## 2023-05-01 MED ORDER — LISINOPRIL 20 MG PO TABS: 20.0000 mg | ORAL_TABLET | Freq: Every day | ORAL | 1 refills | Status: DC

## 2023-05-01 MED ORDER — AMLODIPINE BESYLATE 5 MG PO TABS: 5.0000 mg | ORAL_TABLET | Freq: Every day | ORAL | 1 refills | Status: DC

## 2023-05-01 MED ORDER — ATORVASTATIN CALCIUM 20 MG PO TABS: ORAL_TABLET | ORAL | 1 refills | Status: DC

## 2023-05-01 NOTE — Progress Notes (Signed)
Subjective:  Patient ID: Hunter Adams, male    DOB: 26-May-1970  Age: 53 y.o. MRN: [id number]  CC:  Chief Complaint  Patient presents with   Annual Exam    Pt doing well, pt fasting     HPI Hunter Adams presents for Annual Exam  Knee surgery in August, subsequent injection for pain. Dr. Christell Constant at [facility]. otherwise no new health concerns.   Hypertension: Amlodipine 5mg  qd, lisinopril 20 mg daily. Home readings: none, no side effects with meds  BP Readings from Last 3 Encounters:  05/01/23 128/70  10/25/22 118/74  05/25/22 126/74   Lab Results  Component Value Date   CREATININE 0.99 10/25/2022   Hyperlipidemia: Lipitor 20 mg daily, no new myalgias, se's.  Lab Results  Component Value Date   CHOL 133 10/25/2022   HDL 34.00 (L) 10/25/2022   LDLCALC 84 10/25/2022   TRIG 77.0 10/25/2022   CHOLHDL 4 10/25/2022   Lab Results  Component Value Date   ALT 24 10/25/2022   AST 23 10/25/2022   ALKPHOS 118 (H) 10/25/2022   BILITOT 0.3 10/25/2022    Gout: Last flare: none recent.  Daily meds: Allopurinol 100 mg daily Prn med: none needed Lab Results  Component Value Date   LABURIC 4.9 05/09/2022   Prediabetes: Last A1c at diabetic level. Has been fasting 1st week of each month. Diet/exercise approach, 3 pound weight loss since last visit.  6 pounds since last May. Rare soda. Fast food once per week.  Lab Results  Component Value Date   HGBA1C 6.6 (H) 10/25/2022   Wt Readings from Last 3 Encounters:  05/01/23 237 lb 3.2 oz (107.6 kg)  10/25/22 240 lb 6.4 oz (109 kg)  05/25/22 243 lb 12.8 oz (110.6 kg)   Erectile dysfunction Viagra 100 mg - 1/2 pill, working well, no new headache, flushing, vision or hearing changes, and no chest pain or dyspnea with exertion.  Lumbar facet arthropathy Followed by neurosurgery, Dr. Maisie Fus.  Note reviewed from October. His radicular symptoms controlled did not recommend additional intervention such as injection or surgery.   Did not appear to have facet agenic pain at that time.  Follow-up as needed.  Depression, insomnia Zoloft 50 mg daily, trazodone 50 mg as needed for sleep - infrequent use - once every week or so. Working ok when taken.  [facility]. Had been feeling down. Father's death anniversary March 21, 2024brother passed in December, mom with cancer.  Suicidal ideation - fleeting thought after brother passed -looking at pool from 12th floor at [place] at the time. No attempts. No recent thoughts. Plans to schedule therapist appointment soon. No new side effects on higher dose.     05/01/2023    8:06 AM 10/25/2022    8:19 AM 05/09/2022    8:50 AM 11/08/2021    8:06 AM 05/18/2021    9:00 AM  Depression screen PHQ 2/9  Decreased Interest 1 1 3 2  0  Down, Depressed, Hopeless 1 1 2 1 1   PHQ - 2 Score 2 2 5 3 1   Altered sleeping 1 3 1 1 2   Tired, decreased energy 2 2 1 2 2   Change in appetite 0 1 1 1 1   Feeling bad or failure about yourself  1 1 1 2 2   Trouble concentrating 1 0 0 0 0  Moving slowly or fidgety/restless 2 0 1 1 0  Suicidal thoughts 1 0 1 1 0  PHQ-9 Score 10 9 11 11 8     Health Maintenance  Topic Date Due   Hepatitis C Screening  Never done   Zoster Vaccines- Shingrix (1 of 2) Never done   COVID-19 Vaccine (7 - 2023-24 season) 08/31/2022   INFLUENZA VACCINE  08/01/2023   DTaP/Tdap/Td (2 - Td or Tdap) 06/17/2024   COLONOSCOPY (Pts 45-49yrs Insurance coverage will need to be confirmed)  01/21/2030   HIV Screening  Completed   HPV VACCINES  Aged Out  Colonoscopy 01/22/2020, repeat 10 years Prostate: does not have family history of prostate cancer The natural history of prostate cancer and ongoing controversy regarding screening and potential treatment outcomes of prostate cancer has been discussed with the patient. The meaning of a false positive PSA and a false negative PSA has been discussed. He indicates understanding of the limitations of  this screening test and wishes to proceed with screening PSA testing. Lab Results  Component Value Date   PSA1 3.0 03/20/2019    Immunization History  Administered Date(s) Administered   Influenza,inj,Quad PF,6+ Mos 02/28/2016, 10/14/2019, 10/12/2020, 11/08/2021, 10/25/2022   Moderna Covid-19 Vaccine Bivalent Booster 1yrs & up 10/12/2021   Moderna SARS-COV2 Booster Vaccination 11/04/2020   Moderna Sars-Covid-2 Vaccination 03/10/2020, 04/12/2020, 11/04/2020, 10/12/2021   Tdap 06/17/2014  She was vaccine COVID booster - had latest one at work. Recommended new booster, flu vaccine in fall Hep C screening:today.   No results found. Optho visit pending - received call to schedule.   Dental:every 6 months.   Alcohol: rare. Less than 1/week.   Tobacco: none.   Exercise: walking recently, some weights daily. Over 150 min per week.    History Patient Active Problem List   Diagnosis Date Noted   Mixed hyperlipidemia 10/25/2022   Depression 10/12/2020   Severe obesity (BMI 35.0-35.9 with comorbidity) (HCC) 10/12/2020   Degenerative joint disease 10/12/2020   Insomnia 10/12/2020   Knee pain, chronic 10/12/2020   HTN (hypertension) 06/17/2014   Past Medical History:  Diagnosis Date   Allergy    seasonal   COVID-19 10/23/2019   no complications, no hospitalization   Hyperlipidemia    Hypertension    Past Surgical History:  Procedure Laterality Date   KNEE ARTHROSCOPY W/ MENISCECTOMY Left 07/13/2021   MENISCUS REPAIR Right 08/13/2022   NO PAST SURGERIES     No Known Allergies Prior to Admission medications   Medication Sig Start Date End Date Taking? Authorizing Provider  allopurinol (ZYLOPRIM) 100 MG tablet Take 200 mg by mouth daily. 07/28/21  Yes [provider]  amLODipine (NORVASC) 5 MG tablet Take 1 tablet (5 mg total) by mouth daily. 10/25/22  Yes Shade Flood, MD  atorvastatin (LIPITOR) 20 MG tablet Take 1 tablet by mouth at bedtime. 04/09/22  Yes  [provider]  lisinopril (ZESTRIL) 20 MG tablet Take 1 tablet (20 mg total) by mouth daily. 10/25/22  Yes Shade Flood, MD  Multiple Vitamin (MULTIVITAMIN WITH MINERALS) TABS tablet Take 1 tablet by mouth daily.   Yes [provider]  sertraline (ZOLOFT) 50 MG tablet Take by mouth.   Yes [provider]  sildenafil (VIAGRA) 100 MG tablet TAKE 1/2 TO 1 (ONE-HALF TO ONE) TABLET BY MOUTH DAILY AS NEEDED FOR ERECTILE DYSFUNCTION --  MAXIMUM  OF  100  MG  PER  24  HOUR  PERIOD 10/25/22  Yes Shade Flood, MD  traZODone (DESYREL) 50 MG tablet Take by mouth.   Yes [provider]   Social  History   Socioeconomic History   Marital status: Single    Spouse name: Not on file   Number of children: 3   Years of education: Not on file   Highest education level: Not on file  Occupational History   Not on file  Tobacco Use   Smoking status: Never   Smokeless tobacco: Never  Vaping Use   Vaping Use: Never used  Substance and Sexual Activity   Alcohol use: Yes    Alcohol/week: 2.0 standard drinks of alcohol    Types: 1 Glasses of wine, 1 Cans of beer per week   Drug use: No   Sexual activity: Yes    Partners: Female    Comment: with multiple partners, does not use condoms always  Other Topics Concern   Not on file  Social History Narrative   Pt is from [place], [place]. Has lived in [place] since 1998. Has three children. Lives at home alone. Works at [employer] at [employer] and [employer].    Social Determinants of Health   Financial Resource Strain: Not on file  Food Insecurity: Not on file  Transportation Needs: Not on file  Physical Activity: Insufficiently Active (02/20/2018)   Exercise Vital Sign    Days of Exercise per Week: 3 days    Minutes of Exercise per Session: 30 min  Stress: No Stress Concern Present (02/20/2018)   [employer] of Occupational Health - Occupational Stress Questionnaire    Feeling of Stress : Not at all   Social Connections: Moderately Integrated (02/20/2018)   Social Connection and Isolation Panel [NHANES]    Frequency of Communication with Friends and Family: Twice a week    Frequency of Social Gatherings with Friends and Family: Once a week    Attends Religious Services: More than 4 times per year    Active Member of [employer] or Organizations: Yes    Attends Banker Meetings: More than 4 times per year    Marital Status: Never married  Intimate Partner Violence: Not At Risk (02/20/2018)   Humiliation, Afraid, Rape, and Kick questionnaire    Fear of Current or Ex-Partner: No    Emotionally Abused: No    Physically Abused: No    Sexually Abused: No    Review of Systems 13 point review of systems per patient health survey noted.  Negative other than as indicated above or in HPI.   Objective:   Vitals:   05/01/23 0804  BP: 128/70  Pulse: (!) 57  Temp: 97.8 F (36.6 C)  TempSrc: Temporal  SpO2: 98%  Weight: 237 lb 3.2 oz (107.6 kg)  Height: 5' 8.75" (1.746 m)     Physical Exam Vitals reviewed.  Constitutional:      Appearance: He is well-developed.  HENT:     Head: Normocephalic and atraumatic.  Neck:     Vascular: No carotid bruit or JVD.  Cardiovascular:     Rate and Rhythm: Normal rate and regular rhythm.     Heart sounds: Normal heart sounds. No murmur heard. Pulmonary:     Effort: Pulmonary effort is normal.     Breath sounds: Normal breath sounds. No rales.  Musculoskeletal:     Right lower leg: No edema.     Left lower leg: No edema.  Skin:    General: Skin is warm and dry.  Neurological:     Mental Status: He is alert and oriented to person, place, and time.  Psychiatric:  Mood and Affect: Mood normal.      Assessment & Plan:  Hunter Adams is a 53 y.o. male . Annual physical exam - Plan: CBC with Differential/Platelet, Comprehensive metabolic panel, Lipid panel, PSA  - -anticipatory guidance as below in AVS, screening labs  above. Health maintenance items as above in HPI discussed/recommended as applicable.   Hyperlipidemia, unspecified hyperlipidemia type - Plan: Lipid panel, atorvastatin (LIPITOR) 20 MG tablet  -  Stable, tolerating current regimen. Medications refilled. Labs pending as above.   Essential hypertension - Plan: CBC with Differential/Platelet, Comprehensive metabolic panel, amLODipine (NORVASC) 5 MG tablet, lisinopril (ZESTRIL) 20 MG tablet  -  Stable, tolerating current regimen. Medications refilled. Labs pending as above.   Screening for deficiency anemia - Plan: CBC with Differential/Platelet  Screening for prostate cancer - Plan: PSA  Need for hepatitis C screening test - Plan: Hepatitis C Antibody  Erectile dysfunction, unspecified erectile dysfunction type  - viagra - use lowest effective dose. Side effects discussed (including but not limited to headache/flushing, blue discoloration of vision, possible vascular steal and risk of cardiac effects if underlying unknown coronary artery disease, and permanent sensorineural hearing loss). Understanding expressed.  Prediabetes  - most recent A1c diabetic level, weight has improved. Check labs and adjust plan accordingly.   Other depression - Plan: TSH Screening for thyroid disorder - Plan: TSH  -Recent increase in sertraline, plan for therapy as above.  [id number] hotline discussed if any return of suicidal ideation, denies current suicidal ideation or homicidal ideation, and no recent symptoms.  Meds ordered this encounter  Medications   amLODipine (NORVASC) 5 MG tablet    Sig: Take 1 tablet (5 mg total) by mouth daily.    Dispense:  90 tablet    Refill:  1   lisinopril (ZESTRIL) 20 MG tablet    Sig: Take 1 tablet (20 mg total) by mouth daily.    Dispense:  90 tablet    Refill:  1   atorvastatin (LIPITOR) 20 MG tablet    Sig: TAKE 1 TABLET BY MOUTH ONCE DAILY 6 IN THE EVENING    Dispense:  90 tablet    Refill:  1   Patient Instructions   Thanks for coming in today.  Keep follow-up with therapist as planned and I am hopeful that the new dose of sertraline will make a difference shortly.  If any return of thoughts of hurting yourself, call 988 hotline, or be seen right away.  Hang in there.  I will check some labs today.  If A1c is still at diabetic level we can discuss that further but no medication changes for now.  Take care!  Preventive Care 34-70 Years Old, Male Preventive care refers to lifestyle choices and visits with your health care provider that can promote health and wellness. Preventive care visits are also called wellness exams. What can I expect for my preventive care visit? Counseling During your preventive care visit, your health care provider may ask about your: Medical history, including: Past medical problems. Family medical history. Current health, including: Emotional well-being. Home life and relationship well-being. Sexual activity. Lifestyle, including: Alcohol, nicotine or tobacco, and drug use. Access to firearms. Diet, exercise, and sleep habits. Safety issues such as seatbelt and bike helmet use. Sunscreen use. Work and work Astronomer. Physical exam Your health care provider will check your: Height and weight. These may be used to calculate your BMI (body mass index). BMI is a measurement that tells if you are at a  healthy weight. Waist circumference. This measures the distance around your waistline. This measurement also tells if you are at a healthy weight and may help predict your risk of certain diseases, such as type 2 diabetes and high blood pressure. Heart rate and blood pressure. Body temperature. Skin for abnormal spots. What immunizations do I need?  Vaccines are usually given at various ages, according to a schedule. Your health care provider will recommend vaccines for you based on your age, medical history, and lifestyle or other factors, such as travel or where you  work. What tests do I need? Screening Your health care provider may recommend screening tests for certain conditions. This may include: Lipid and cholesterol levels. Diabetes screening. This is done by checking your blood sugar (glucose) after you have not eaten for a while (fasting). Hepatitis B test. Hepatitis C test. HIV (human immunodeficiency virus) test. STI (sexually transmitted infection) testing, if you are at risk. Lung cancer screening. Prostate cancer screening. Colorectal cancer screening. Talk with your health care provider about your test results, treatment options, and if necessary, the need for more tests. Follow these instructions at home: Eating and drinking  Eat a diet that includes fresh fruits and vegetables, whole grains, lean protein, and low-fat dairy products. Take vitamin and mineral supplements as recommended by your health care provider. Do not drink alcohol if your health care provider tells you not to drink. If you drink alcohol: Limit how much you have to 0-2 drinks a day. Know how much alcohol is in your drink. In the [place] one drink equals one 12 oz bottle of beer (355 mL), one 5 oz glass of wine (148 mL), or one 1 oz glass of hard liquor (44 mL). Lifestyle Brush your teeth every morning and night with fluoride toothpaste. Floss one time each day. Exercise for at least 30 minutes 5 or more days each week. Do not use any products that contain nicotine or tobacco. These products include cigarettes, chewing tobacco, and vaping devices, such as e-cigarettes. If you need help quitting, ask your health care provider. Do not use drugs. If you are sexually active, practice safe sex. Use a condom or other form of protection to prevent STIs. Take aspirin only as told by your health care provider. Make sure that you understand how much to take and what form to take. Work with your health care provider to find out whether it is safe and beneficial for you to take  aspirin daily. Find healthy ways to manage stress, such as: Meditation, yoga, or listening to music. Journaling. Talking to a trusted person. Spending time with friends and family. Minimize exposure to UV radiation to reduce your risk of skin cancer. Safety Always wear your seat belt while driving or riding in a vehicle. Do not drive: If you have been drinking alcohol. Do not ride with someone who has been drinking. When you are tired or distracted. While texting. If you have been using any mind-altering substances or drugs. Wear a helmet and other protective equipment during sports activities. If you have firearms in your house, make sure you follow all gun safety procedures. What's next? Go to your health care provider once a year for an annual wellness visit. Ask your health care provider how often you should have your eyes and teeth checked. Stay up to date on all vaccines. This information is not intended to replace advice given to you by your health care provider. Make sure you discuss any questions you have with  your health care provider. Document Revised: 06/14/2021 Document Reviewed: 06/14/2021 Elsevier Patient Education  2023 [employer] Inc.     Signed,   Meredith Staggers, MD [facility], [facility] 05/01/23 8:08 AM

## 2023-05-01 NOTE — Patient Instructions (Addendum)
Thanks for coming in today.  Keep follow-up with therapist as planned and I am hopeful that the new dose of sertraline will make a difference shortly.  If any return of thoughts of hurting yourself, call 988 hotline, or be seen right away.  Hang in there.  I will check some labs today.  If A1c is still at diabetic level we can discuss that further but no medication changes for now.  Take care!  Preventive Care 45-55 Years Old, Male Preventive care refers to lifestyle choices and visits with your health care provider that can promote health and wellness. Preventive care visits are also called wellness exams. What can I expect for my preventive care visit? Counseling During your preventive care visit, your health care provider may ask about your: Medical history, including: Past medical problems. Family medical history. Current health, including: Emotional well-being. Home life and relationship well-being. Sexual activity. Lifestyle, including: Alcohol, nicotine or tobacco, and drug use. Access to firearms. Diet, exercise, and sleep habits. Safety issues such as seatbelt and bike helmet use. Sunscreen use. Work and work Astronomer. Physical exam Your health care provider will check your: Height and weight. These may be used to calculate your BMI (body mass index). BMI is a measurement that tells if you are at a healthy weight. Waist circumference. This measures the distance around your waistline. This measurement also tells if you are at a healthy weight and may help predict your risk of certain diseases, such as type 2 diabetes and high blood pressure. Heart rate and blood pressure. Body temperature. Skin for abnormal spots. What immunizations do I need?  Vaccines are usually given at various ages, according to a schedule. Your health care provider will recommend vaccines for you based on your age, medical history, and lifestyle or other factors, such as travel or where you  work. What tests do I need? Screening Your health care provider may recommend screening tests for certain conditions. This may include: Lipid and cholesterol levels. Diabetes screening. This is done by checking your blood sugar (glucose) after you have not eaten for a while (fasting). Hepatitis B test. Hepatitis C test. HIV (human immunodeficiency virus) test. STI (sexually transmitted infection) testing, if you are at risk. Lung cancer screening. Prostate cancer screening. Colorectal cancer screening. Talk with your health care provider about your test results, treatment options, and if necessary, the need for more tests. Follow these instructions at home: Eating and drinking  Eat a diet that includes fresh fruits and vegetables, whole grains, lean protein, and low-fat dairy products. Take vitamin and mineral supplements as recommended by your health care provider. Do not drink alcohol if your health care provider tells you not to drink. If you drink alcohol: Limit how much you have to 0-2 drinks a day. Know how much alcohol is in your drink. In the [place] one drink equals one 12 oz bottle of beer (355 mL), one 5 oz glass of wine (148 mL), or one 1 oz glass of hard liquor (44 mL). Lifestyle Brush your teeth every morning and night with fluoride toothpaste. Floss one time each day. Exercise for at least 30 minutes 5 or more days each week. Do not use any products that contain nicotine or tobacco. These products include cigarettes, chewing tobacco, and vaping devices, such as e-cigarettes. If you need help quitting, ask your health care provider. Do not use drugs. If you are sexually active, practice safe sex. Use a condom or other form of protection to prevent STIs.  Take aspirin only as told by your health care provider. Make sure that you understand how much to take and what form to take. Work with your health care provider to find out whether it is safe and beneficial for you to take  aspirin daily. Find healthy ways to manage stress, such as: Meditation, yoga, or listening to music. Journaling. Talking to a trusted person. Spending time with friends and family. Minimize exposure to UV radiation to reduce your risk of skin cancer. Safety Always wear your seat belt while driving or riding in a vehicle. Do not drive: If you have been drinking alcohol. Do not ride with someone who has been drinking. When you are tired or distracted. While texting. If you have been using any mind-altering substances or drugs. Wear a helmet and other protective equipment during sports activities. If you have firearms in your house, make sure you follow all gun safety procedures. What's next? Go to your health care provider once a year for an annual wellness visit. Ask your health care provider how often you should have your eyes and teeth checked. Stay up to date on all vaccines. This information is not intended to replace advice given to you by your health care provider. Make sure you discuss any questions you have with your health care provider. Document Revised: 06/14/2021 Document Reviewed: 06/14/2021 Elsevier Patient Education  2023 [employer].

## 2023-05-02 LAB — HEPATITIS C ANTIBODY: Hepatitis C Ab: NONREACTIVE

## 2023-07-05 ENCOUNTER — Telehealth: Payer: Self-pay

## 2023-07-05 NOTE — Telephone Encounter (Signed)
Pt is aware of lab results from 05/07/23

## 2023-08-18 ENCOUNTER — Other Ambulatory Visit: Payer: Self-pay | Admitting: Family Medicine

## 2023-08-18 DIAGNOSIS — I10 Essential (primary) hypertension: Secondary | ICD-10-CM

## 2023-11-04 ENCOUNTER — Encounter: Payer: Self-pay | Admitting: Family Medicine

## 2023-11-04 ENCOUNTER — Ambulatory Visit (INDEPENDENT_AMBULATORY_CARE_PROVIDER_SITE_OTHER): Payer: BC Managed Care – PPO | Admitting: Family Medicine

## 2023-11-04 VITALS — BP 118/76 | HR 59 | Temp 98.2°F | Ht 68.75 in | Wt 246.4 lb

## 2023-11-04 DIAGNOSIS — M1711 Unilateral primary osteoarthritis, right knee: Secondary | ICD-10-CM | POA: Insufficient documentation

## 2023-11-04 DIAGNOSIS — Z4789 Encounter for other orthopedic aftercare: Secondary | ICD-10-CM | POA: Insufficient documentation

## 2023-11-04 DIAGNOSIS — M5416 Radiculopathy, lumbar region: Secondary | ICD-10-CM | POA: Insufficient documentation

## 2023-11-04 DIAGNOSIS — N529 Male erectile dysfunction, unspecified: Secondary | ICD-10-CM | POA: Insufficient documentation

## 2023-11-04 DIAGNOSIS — M109 Gout, unspecified: Secondary | ICD-10-CM | POA: Insufficient documentation

## 2023-11-04 DIAGNOSIS — G4730 Sleep apnea, unspecified: Secondary | ICD-10-CM | POA: Insufficient documentation

## 2023-11-04 DIAGNOSIS — I10 Essential (primary) hypertension: Secondary | ICD-10-CM | POA: Diagnosis not present

## 2023-11-04 DIAGNOSIS — F3289 Other specified depressive episodes: Secondary | ICD-10-CM

## 2023-11-04 DIAGNOSIS — E785 Hyperlipidemia, unspecified: Secondary | ICD-10-CM

## 2023-11-04 DIAGNOSIS — Z01818 Encounter for other preprocedural examination: Secondary | ICD-10-CM | POA: Insufficient documentation

## 2023-11-04 DIAGNOSIS — Z4889 Encounter for other specified surgical aftercare: Secondary | ICD-10-CM | POA: Insufficient documentation

## 2023-11-04 DIAGNOSIS — F6381 Intermittent explosive disorder: Secondary | ICD-10-CM | POA: Insufficient documentation

## 2023-11-04 DIAGNOSIS — M2140 Flat foot [pes planus] (acquired), unspecified foot: Secondary | ICD-10-CM | POA: Insufficient documentation

## 2023-11-04 DIAGNOSIS — R7303 Prediabetes: Secondary | ICD-10-CM | POA: Diagnosis not present

## 2023-11-04 DIAGNOSIS — Z23 Encounter for immunization: Secondary | ICD-10-CM | POA: Diagnosis not present

## 2023-11-04 DIAGNOSIS — F329 Major depressive disorder, single episode, unspecified: Secondary | ICD-10-CM | POA: Insufficient documentation

## 2023-11-04 DIAGNOSIS — S83281A Other tear of lateral meniscus, current injury, right knee, initial encounter: Secondary | ICD-10-CM | POA: Insufficient documentation

## 2023-11-04 DIAGNOSIS — M545 Low back pain, unspecified: Secondary | ICD-10-CM | POA: Insufficient documentation

## 2023-11-04 LAB — COMPREHENSIVE METABOLIC PANEL
ALT: 21 U/L (ref 0–53)
AST: 22 U/L (ref 0–37)
Albumin: 4.4 g/dL (ref 3.5–5.2)
Alkaline Phosphatase: 107 U/L (ref 39–117)
BUN: 11 mg/dL (ref 6–23)
CO2: 30 meq/L (ref 19–32)
Calcium: 9.6 mg/dL (ref 8.4–10.5)
Chloride: 103 meq/L (ref 96–112)
Creatinine, Ser: 1.11 mg/dL (ref 0.40–1.50)
GFR: 75.1 mL/min (ref >60.00)
Glucose, Bld: 82 mg/dL (ref 70–99)
Potassium: 3.8 meq/L (ref 3.5–5.1)
Sodium: 141 meq/L (ref 135–145)
Total Bilirubin: 0.6 mg/dL (ref 0.2–1.2)
Total Protein: 7.6 g/dL (ref 6.0–8.3)

## 2023-11-04 LAB — LIPID PANEL
Cholesterol: 135 mg/dL (ref 0–200)
HDL: 33.7 mg/dL — ABNORMAL LOW (ref >39.00)
LDL Cholesterol: 76 mg/dL (ref 0–99)
NonHDL: 101.06
Total CHOL/HDL Ratio: 4
Triglycerides: 123 mg/dL (ref 0.0–149.0)
VLDL: 24.6 mg/dL (ref 0.0–40.0)

## 2023-11-04 LAB — HEMOGLOBIN A1C: Hgb A1c MFr Bld: 6.5 % (ref 4.6–6.5)

## 2023-11-04 MED ORDER — ATORVASTATIN CALCIUM 20 MG PO TABS: ORAL_TABLET | ORAL | 1 refills | Status: DC

## 2023-11-04 MED ORDER — LISINOPRIL 20 MG PO TABS: 20.0000 mg | ORAL_TABLET | Freq: Every day | ORAL | 1 refills | Status: DC

## 2023-11-04 MED ORDER — LISINOPRIL 20 MG PO TABS: 20.0000 mg | ORAL_TABLET | Freq: Every day | ORAL | 0 refills | Status: DC

## 2023-11-04 MED ORDER — AMLODIPINE BESYLATE 5 MG PO TABS: 5.0000 mg | ORAL_TABLET | Freq: Every day | ORAL | 1 refills | Status: DC

## 2023-11-04 NOTE — Addendum Note (Signed)
Addended by: Meredith Staggers R on: 11/04/2023 08:44 AM   Modules accepted: Orders

## 2023-11-04 NOTE — Patient Instructions (Addendum)
Increase exercise with goal of 129min/week. Start low and go slow intensity. Cut back on fast food, and depending on diabetes test we may need to discuss other treatments.   Please follow up with psychiatry as planned and share with them your symptoms to see if any med changes needed.  Hang in there.  If any concerns on labs I will let you know.  No medication changes for now.  Take care

## 2023-11-04 NOTE — Progress Notes (Signed)
Subjective:  Patient ID: Hunter Adams, male    DOB: July 21, 1970  Age: 53 y.o. MRN: [id number]  CC:  Chief Complaint  Patient presents with   Medical Management of Chronic Issues    Pt is doing well, does not have questions at this time     HPI Hunter Adams presents for follow up - last visit in May for CPE.    PCP, me.  Also has primary care provider through the [facility]. Ortho, Dr. Christell Constant at [facility], prior knee surgery, gel injection in September.  Neurosurgery, Dr. Maisie Fus, lumbar facet arthropathy.  Has C5-7 issues in [facility].  Had splint for left thumb at [facility].   Hypertension: Amlodipine 5 mg daily.  Lisinopril 20 mg daily. Home readings: 120/70 range.  BP Readings from Last 3 Encounters:  11/04/23 118/76  05/01/23 128/70  10/25/22 118/74   Lab Results  Component Value Date   CREATININE 1.15 05/01/2023   Hyperlipidemia: Lipitor 20 mg daily without new myalgias or side effects. Lab Results  Component Value Date   CHOL 126 05/01/2023   HDL 36.10 (L) 05/01/2023   LDLCALC 76 05/01/2023   TRIG 72.0 05/01/2023   CHOLHDL 4 05/01/2023   Lab Results  Component Value Date   ALT 18 05/01/2023   AST 18 05/01/2023   ALKPHOS 102 05/01/2023   BILITOT 0.7 05/01/2023   Gout: Last flare: none.  Daily meds: Allopurinol 100 mg daily for suppression. No new side effects.  Prn med:  Lab Results  Component Value Date   LABURIC 4.9 05/09/2022    Prediabetes: Previously prediabetic range, had crossed into diabetic range in October 2023, has not been checked since that time.  Weight is down 3 pounds from that visit.  Had lost 3 pounds from May of that year. Less exercise recently.  Sugar beverages - rare.  Fast food - 4 d/week.  Lab Results  Component Value Date   HGBA1C 6.6 (H) 10/25/2022   Wt Readings from Last 3 Encounters:  11/04/23 246 lb 6.4 oz (111.8 kg)  05/01/23 237 lb 3.2 oz (107.6 kg)  10/25/22 240 lb 6.4 oz  (109 kg)   Erectile dysfunction Treated with sildenafil 100 mg.  Slight HA with taking med. Denies any flushing, hearing or vision changes or chest pain/dyspnea with exertion.  Effective dose of 50mg .   Depression with insomnia Depression treated with sertraline 50 mg daily ( cuts 100mg  in half).   Trazodone as needed for sleep but infrequent use when discussed last year.  Some increased depression symptoms last year after the anniversary of his father dose in March and then brother passed in December of prior year, and mother had been diagnosed with cancer. He is followed by therapist, and psychiatrist at [facility] - they are managing those meds.  Suicidal thoughts - fleeting thoughts of death, worry. No plan. No prior attempts. Psychiatrist aware. No current SI. Appt with psychiatry Thursday. Anger mgt classes on Mondays.   Mom with ovarian CA - going through chemo.      11/04/2023    8:09 AM 05/01/2023    8:06 AM 10/25/2022    8:19 AM 05/09/2022    8:50 AM 11/08/2021    8:06 AM  Depression screen PHQ 2/9  Decreased Interest 1 1 1 3 2   Down, Depressed, Hopeless 1 1 1 2 1   PHQ - 2 Score 2 2 2 5 3   Altered sleeping 2 1 3  1  1  Tired, decreased energy 2 2 2 1 2   Change in appetite 1 0 1 1 1   Feeling bad or failure about yourself  1 1 1 1 2   Trouble concentrating 1 1 0 0 0  Moving slowly or fidgety/restless 2 2 0 1 1  Suicidal thoughts 1 1 0 1 1  PHQ-9 Score 12 10 9 11 11       11/04/2023    8:10 AM 05/01/2023    8:07 AM  GAD 7 : Generalized Anxiety Score  Nervous, Anxious, on Edge 1 2  Control/stop worrying 1 1  Worry too much - different things 2 1  Trouble relaxing 2 1  Restless 2 1  Easily annoyed or irritable 3 2  Afraid - awful might happen 0 1  Total GAD 7 Score 11 9      Immunization History  Administered Date(s) Administered   Influenza,inj,Quad PF,6+ Mos 02/28/2016, 10/14/2019, 10/12/2020, 11/08/2021, 10/25/2022   Moderna Covid-19 Vaccine Bivalent Booster 15yrs & up  10/12/2021   Moderna SARS-COV2 Booster Vaccination 11/04/2020   Moderna Sars-Covid-2 Vaccination 03/10/2020, 04/12/2020, 11/04/2020, 10/12/2021   Tdap 06/17/2014  Shingrix - at [facility] about a year or two ago.  COVID booster recommended at pharmacy.  Flu vaccine: today.    History Patient Active Problem List   Diagnosis Date Noted   Encounter for other orthopedic aftercare 11/04/2023   Encounter for other specified surgical aftercare 11/04/2023   Erectile dysfunction 11/04/2023   Flat foot (pes planus) (acquired), unspecified foot 11/04/2023   Gout, unspecified 11/04/2023   Intermittent explosive disorder 11/04/2023   Chronic low back pain 11/04/2023   Lumbar radiculopathy 11/04/2023   Major depressive disorder, single episode, unspecified 11/04/2023   Encounter for other preprocedural examination 11/04/2023   Other tear of lateral meniscus, current injury, right knee, initial encounter 11/04/2023   Sleep apnea, unspecified 11/04/2023   Unilateral primary osteoarthritis, right knee 11/04/2023   Mixed hyperlipidemia 10/25/2022   Depression 10/12/2020   Severe obesity (BMI 35.0-35.9 with comorbidity) (HCC) 10/12/2020   Degenerative joint disease 10/12/2020   Insomnia 10/12/2020   Knee pain, chronic 10/12/2020   HTN (hypertension) 06/17/2014   Past Medical History:  Diagnosis Date   Allergy    seasonal   COVID-19 10/23/2019   no complications, no hospitalization   Hyperlipidemia    Hypertension    Past Surgical History:  Procedure Laterality Date   KNEE ARTHROSCOPY W/ MENISCECTOMY Left 07/13/2021   MENISCUS REPAIR Right 08/13/2022   NO PAST SURGERIES     No Known Allergies Prior to Admission medications   Medication Sig Start Date End Date Taking? Authorizing Provider  allopurinol (ZYLOPRIM) 100 MG tablet Take 200 mg by mouth daily. 07/28/21  Yes [provider]  amLODipine (NORVASC) 5 MG tablet Take 1 tablet (5 mg total) by mouth daily. 05/01/23  Yes Shade Flood, MD  atorvastatin (LIPITOR) 20 MG tablet TAKE 1 TABLET BY MOUTH ONCE DAILY 6 IN THE EVENING 05/01/23  Yes Shade Flood, MD  diclofenac Sodium (VOLTAREN) 1 % GEL Apply topically. 04/11/23  Yes [provider]  doxycycline (MONODOX) 100 MG capsule Take 100 mg by mouth 2 (two) times daily. 09/02/23  Yes [provider]  lisinopril (ZESTRIL) 20 MG tablet Take 1 tablet by mouth once daily 08/19/23  Yes Shade Flood, MD  Multiple Vitamin (MULTIVITAMIN WITH MINERALS) TABS tablet Take 1 tablet by mouth daily.   Yes [provider]  sertraline (ZOLOFT) 100 MG tablet Take  by mouth. 04/11/23  Yes [provider]  sildenafil (VIAGRA) 100 MG tablet TAKE 1/2 TO 1 (ONE-HALF TO ONE) TABLET BY MOUTH DAILY AS NEEDED FOR ERECTILE DYSFUNCTION --  MAXIMUM  OF  100  MG  PER  24  HOUR  PERIOD 10/25/22  Yes Shade Flood, MD  traZODone (DESYREL) 50 MG tablet Take by mouth.   Yes [provider]  sertraline (ZOLOFT) 50 MG tablet Take by mouth. Patient not taking: Reported on 11/04/2023    [provider]  sildenafil (VIAGRA) 100 MG tablet Take by mouth. Patient not taking: Reported on 11/04/2023 04/11/23   [provider]   Social History   Socioeconomic History   Marital status: Single    Spouse name: Not on file   Number of children: 3   Years of education: Not on file   Highest education level: Not on file  Occupational History   Not on file  Tobacco Use   Smoking status: Never   Smokeless tobacco: Never  Vaping Use   Vaping status: Never Used  Substance and Sexual Activity   Alcohol use: Yes    Alcohol/week: 2.0 standard drinks of alcohol    Types: 1 Glasses of wine, 1 Cans of beer per week   Drug use: No   Sexual activity: Yes    Partners: Female    Comment: with multiple partners, does not use condoms always  Other Topics Concern   Not on file  Social History Narrative   Pt is from [place], [place]. Has lived in [place] since  1998. Has three children. Lives at home alone. Works at [employer] at [employer] and [employer].    Social Determinants of Health   Financial Resource Strain: Not on file  Food Insecurity: Not on file  Transportation Needs: Not on file  Physical Activity: Insufficiently Active (02/20/2018)   Exercise Vital Sign    Days of Exercise per Week: 3 days    Minutes of Exercise per Session: 30 min  Stress: No Stress Concern Present (02/20/2018)   [employer] of Occupational Health - Occupational Stress Questionnaire    Feeling of Stress : Not at all  Social Connections: Unknown (07/17/2022)   Received from [facility]   Social Network    Social Network: Not on file  Intimate Partner Violence: Unknown (07/17/2022)   [facility]   HITS    Physically Hurt: Not on file    Insult or Talk Down To: Not on file    Threaten Physical Harm: Not on file    Scream or Curse: Not on file    Review of Systems  Constitutional:  Negative for fatigue and unexpected weight change.  Eyes:  Negative for visual disturbance.  Respiratory:  Negative for cough, chest tightness and shortness of breath.   Cardiovascular:  Negative for chest pain, palpitations and leg swelling.  Gastrointestinal:  Negative for abdominal pain and blood in stool.  Neurological:  Negative for dizziness, light-headedness and headaches.     Objective:   Vitals:   11/04/23 0812  BP: 118/76  Pulse: (!) 59  Temp: 98.2 F (36.8 C)  TempSrc: Temporal  SpO2: 99%  Weight: 246 lb 6.4 oz (111.8 kg)  Height: 5' 8.75" (1.746 m)     Physical Exam Vitals reviewed.  Constitutional:      Appearance: He is well-developed.  HENT:     Head: Normocephalic and atraumatic.  Neck:  Vascular: No carotid bruit or JVD.  Cardiovascular:     Rate and Rhythm: Normal rate and regular rhythm.     Heart sounds: Normal heart sounds. No murmur heard. Pulmonary:     Effort: Pulmonary effort is  normal.     Breath sounds: Normal breath sounds. No rales.  Musculoskeletal:     Right lower leg: No edema.     Left lower leg: No edema.  Skin:    General: Skin is warm and dry.  Neurological:     Mental Status: He is alert and oriented to person, place, and time.  Psychiatric:     Comments: Flat affect, fair eye contact, appropriate responses.  Does not appear to be responding to internal stimuli.  Denies active SI.        Assessment & Plan:  Hunter Adams is a 53 y.o. male . Hyperlipidemia, unspecified hyperlipidemia type - Plan: Comprehensive metabolic panel, Lipid panel, Hemoglobin A1c, atorvastatin (LIPITOR) 20 MG tablet  -  Stable, tolerating current regimen. Medications refilled. Labs pending as above.   Essential hypertension - Plan: amLODipine (NORVASC) 5 MG tablet, lisinopril (ZESTRIL) 20 MG tablet, DISCONTINUED: lisinopril (ZESTRIL) 20 MG tablet  -  Stable, tolerating current regimen. Medications refilled. Labs pending as above.   Prediabetes  -History of prediabetes, A1c at diabetic level last visit.  Check labs and if still at diabetes level will discuss different treatments.  For now recommended increased exercise, decreased fast food.  61-month follow-up depending on A1c.  Other depression  -Persistent depression, under the care of psychiatry, therapist, classes as above.  Has close follow-up with psychiatry later this week and they are aware of his history of suicidal ideation.  Denies active SI at this time.  Recommended discussing his symptoms including his medications with psychiatry this week to determine if any changes needed.  Erectile dysfunction, unspecified erectile dysfunction type  -Stable with use of Viagra, potential side effects discussed.  Meds ordered this encounter  Medications   amLODipine (NORVASC) 5 MG tablet    Sig: Take 1 tablet (5 mg total) by mouth daily.    Dispense:  90 tablet    Refill:  1   DISCONTD: lisinopril (ZESTRIL) 20 MG  tablet    Sig: Take 1 tablet (20 mg total) by mouth daily.    Dispense:  90 tablet    Refill:  0   atorvastatin (LIPITOR) 20 MG tablet    Sig: TAKE 1 TABLET BY MOUTH ONCE DAILY 6 IN THE EVENING    Dispense:  90 tablet    Refill:  1   lisinopril (ZESTRIL) 20 MG tablet    Sig: Take 1 tablet (20 mg total) by mouth daily.    Dispense:  90 tablet    Refill:  1   Patient Instructions  Increase exercise with goal of 154min/week. Start low and go slow intensity. Cut back on fast food, and depending on diabetes test we may need to discuss other treatments.   Please follow up with psychiatry as planned and share with them your symptoms to see if any med changes needed.  Hang in there.  If any concerns on labs I will let you know.  No medication changes for now.  Take care      Signed,   Meredith Staggers, MD [facility], [facility] 11/04/23 8:39 AM

## 2023-12-23 ENCOUNTER — Encounter: Payer: Self-pay | Admitting: Family Medicine

## 2024-04-22 ENCOUNTER — Other Ambulatory Visit: Payer: Self-pay | Admitting: Family Medicine

## 2024-04-22 DIAGNOSIS — N529 Male erectile dysfunction, unspecified: Secondary | ICD-10-CM

## 2024-04-22 NOTE — Telephone Encounter (Signed)
 Not filled since 2023 please advise

## 2024-04-23 ENCOUNTER — Other Ambulatory Visit (HOSPITAL_COMMUNITY): Payer: Self-pay

## 2024-04-23 ENCOUNTER — Other Ambulatory Visit: Payer: Self-pay | Admitting: Family Medicine

## 2024-04-23 ENCOUNTER — Other Ambulatory Visit: Payer: Self-pay

## 2024-04-23 DIAGNOSIS — I10 Essential (primary) hypertension: Secondary | ICD-10-CM

## 2024-04-23 MED ORDER — SILDENAFIL CITRATE 100 MG PO TABS
ORAL_TABLET | ORAL | 5 refills | Status: DC
  Filled 2024-04-23: qty 5, 10d supply, fill #0
  Filled 2024-04-27: qty 5, 5d supply, fill #0

## 2024-04-23 NOTE — Telephone Encounter (Signed)
 Erectile dysfunction discussed at 11/04/2023 visit.  Refill ordered.

## 2024-04-24 ENCOUNTER — Other Ambulatory Visit: Payer: Self-pay

## 2024-04-24 ENCOUNTER — Encounter: Payer: Self-pay | Admitting: Pharmacist

## 2024-04-27 ENCOUNTER — Other Ambulatory Visit (HOSPITAL_COMMUNITY): Payer: Self-pay

## 2024-04-27 ENCOUNTER — Other Ambulatory Visit: Payer: Self-pay

## 2024-04-28 ENCOUNTER — Other Ambulatory Visit: Payer: Self-pay

## 2024-05-11 ENCOUNTER — Ambulatory Visit (INDEPENDENT_AMBULATORY_CARE_PROVIDER_SITE_OTHER): Payer: BC Managed Care – PPO | Admitting: Family Medicine

## 2024-05-11 ENCOUNTER — Encounter: Payer: Self-pay | Admitting: Family Medicine

## 2024-05-11 VITALS — BP 122/76 | HR 60 | Temp 98.4°F | Ht 68.5 in | Wt 240.6 lb

## 2024-05-11 DIAGNOSIS — Z0000 Encounter for general adult medical examination without abnormal findings: Secondary | ICD-10-CM

## 2024-05-11 DIAGNOSIS — E785 Hyperlipidemia, unspecified: Secondary | ICD-10-CM

## 2024-05-11 DIAGNOSIS — N529 Male erectile dysfunction, unspecified: Secondary | ICD-10-CM

## 2024-05-11 DIAGNOSIS — Z125 Encounter for screening for malignant neoplasm of prostate: Secondary | ICD-10-CM | POA: Diagnosis not present

## 2024-05-11 DIAGNOSIS — F3289 Other specified depressive episodes: Secondary | ICD-10-CM

## 2024-05-11 DIAGNOSIS — R972 Elevated prostate specific antigen [PSA]: Secondary | ICD-10-CM

## 2024-05-11 DIAGNOSIS — R7303 Prediabetes: Secondary | ICD-10-CM | POA: Diagnosis not present

## 2024-05-11 DIAGNOSIS — I10 Essential (primary) hypertension: Secondary | ICD-10-CM | POA: Diagnosis not present

## 2024-05-11 LAB — COMPREHENSIVE METABOLIC PANEL WITH GFR
ALT: 16 U/L (ref 0–53)
AST: 17 U/L (ref 0–37)
Albumin: 4.5 g/dL (ref 3.5–5.2)
Alkaline Phosphatase: 96 U/L (ref 39–117)
BUN: 11 mg/dL (ref 6–23)
CO2: 28 meq/L (ref 19–32)
Calcium: 9.6 mg/dL (ref 8.4–10.5)
Chloride: 102 meq/L (ref 96–112)
Creatinine, Ser: 1.1 mg/dL (ref 0.40–1.50)
GFR: 75.64 mL/min (ref >60.00)
Glucose, Bld: 102 mg/dL — ABNORMAL HIGH (ref 70–99)
Potassium: 4.4 meq/L (ref 3.5–5.1)
Sodium: 140 meq/L (ref 135–145)
Total Bilirubin: 0.7 mg/dL (ref 0.2–1.2)
Total Protein: 7.3 g/dL (ref 6.0–8.3)

## 2024-05-11 LAB — LIPID PANEL
Cholesterol: 126 mg/dL (ref 0–200)
HDL: 33.6 mg/dL — ABNORMAL LOW (ref >39.00)
LDL Cholesterol: 71 mg/dL (ref 0–99)
NonHDL: 91.92
Total CHOL/HDL Ratio: 4
Triglycerides: 103 mg/dL (ref 0.0–149.0)
VLDL: 20.6 mg/dL (ref 0.0–40.0)

## 2024-05-11 LAB — HEMOGLOBIN A1C: Hgb A1c MFr Bld: 6.4 % (ref 4.6–6.5)

## 2024-05-11 LAB — PSA: PSA: 4.64 ng/mL — ABNORMAL HIGH (ref 0.10–4.00)

## 2024-05-11 MED ORDER — LISINOPRIL 20 MG PO TABS: 20.0000 mg | ORAL_TABLET | Freq: Every day | ORAL | 2 refills | Status: AC

## 2024-05-11 MED ORDER — AMLODIPINE BESYLATE 5 MG PO TABS: 5.0000 mg | ORAL_TABLET | Freq: Every day | ORAL | 2 refills | Status: DC

## 2024-05-11 MED ORDER — SILDENAFIL CITRATE 100 MG PO TABS: ORAL_TABLET | ORAL | 5 refills | Status: DC

## 2024-05-11 MED ORDER — ATORVASTATIN CALCIUM 20 MG PO TABS: ORAL_TABLET | ORAL | 2 refills | Status: DC

## 2024-05-11 NOTE — Progress Notes (Addendum)
 Subjective:  Patient ID: Hunter Adams, male    DOB: September 01, 1970  Age: 54 y.o. MRN: [id number]  CC:  Chief Complaint  Patient presents with   Annual Exam    Pt is well, no concerns, not fasting     HPI Hunter Adams presents for Annual Exam  PCP, me He is also followed by the [place], service-connected for knee issues, mood disorder, sciatic nerve issue and degenerative arthritis of the spine.  Psychologist, Kossi Sevon.  Treated with sertraline, cognitive behavioral therapy.  History of right knee arthritis and meniscal tear status post injection.  Left shoulder and neck pain in November with plan for MRI.  Medical/primary at [facility] - Dr. Leanna Promise.  On PT for frozen shoulder.  Has seen Dr. Sulema [facility]  [facility]  Neurosurgery Dr. Andy Bannister.  Mom still going through chemo with ovarian cancer. Sees her monthly - [place]. Doing kn.   Hypertension: Amlodipine  5 mg daily, lisinopril  20 mg daily. No side effects.  Home readings: not usually, normal range 120/70 at [facility].  BP Readings from Last 3 Encounters:  05/11/24 122/76  11/04/23 118/76  05/01/23 128/70   Lab Results  Component Value Date   CREATININE 1.11 11/04/2023   Hyperlipidemia: Lipitor 20 mg daily without any myalgias/side effects.  Lab Results  Component Value Date   CHOL 135 11/04/2023   HDL 33.70 (L) 11/04/2023   LDLCALC 76 11/04/2023   TRIG 123.0 11/04/2023   CHOLHDL 4 11/04/2023   Lab Results  Component Value Date   ALT 21 11/04/2023   AST 22 11/04/2023   ALKPHOS 107 11/04/2023   BILITOT 0.6 11/04/2023   Gout: Last flare: None recent Daily meds: Allopurinol 100 mg daily. Prn med: Not needed Lab Results  Component Value Date   LABURIC 4.9 05/09/2022   Prediabetes: Borderline diabetes with history of prediabetes.  Weight has improved since his November visit.  Diet/exercise approach planned with continued work on fast food and avoidance of sugar  beverages. Plan for nutritionist eval at [facility] - looking at weight loss injections, appt later this month.  Lab Results  Component Value Date   HGBA1C 6.5 11/04/2023   Wt Readings from Last 3 Encounters:  05/11/24 240 lb 9.6 oz (109.1 kg)  11/04/23 246 lb 6.4 oz (111.8 kg)  05/01/23 237 lb 3.2 oz (107.6 kg)   Erectile dysfunction Treated with sildenafil  100 mg -50 mg dose has been effective, minimal headache at times - less with more hydration. denies any flushing hearing/vision changes or chest pain/dyspnea with exertion or intercourse.  Mood disorder/depression with insomnia Treated with sertraline 100 mg daily, trazodone as needed for sleep previously.  Followed by psychiatry, therapy as above through [place]. On CPAP since March - through [place]. Sleeping better, no meds at night needed.  Also some RLS, no meds needed.      05/11/2024    8:09 AM 11/04/2023    8:09 AM 05/01/2023    8:06 AM 10/25/2022    8:19 AM 05/09/2022    8:50 AM  Depression screen PHQ 2/9  Decreased Interest 1 1 1 1 3   Down, Depressed, Hopeless 1 1 1 1 2   PHQ - 2 Score 2 2 2 2 5   Altered sleeping 1 2 1 3 1   Tired, decreased energy 1 2 2 2 1   Change in appetite 2 1 0 1 1  Feeling bad or failure about yourself  1 1 1  1  1  Trouble concentrating 1 1 1  0 0  Moving slowly or fidgety/restless 1 2 2  0 1  Suicidal thoughts 0 1 1 0 1  PHQ-9 Score 9 12 10 9 11   Difficult doing work/chores Somewhat difficult          05/11/2024    8:10 AM 11/04/2023    8:10 AM 05/01/2023    8:07 AM  GAD 7 : Generalized Anxiety Score  Nervous, Anxious, on Edge 1 1 2   Control/stop worrying 1 1 1   Worry too much - different things 2 2 1   Trouble relaxing 1 2 1   Restless 1 2 1   Easily annoyed or irritable 1 3 2   Afraid - awful might happen 0 0 1  Total GAD 7 Score 7 11 9           05/11/2024    8:09 AM 11/04/2023    8:09 AM 05/01/2023    8:06 AM 10/25/2022    8:19 AM 05/09/2022    8:50 AM  Depression screen PHQ 2/9  Decreased Interest 1  1 1 1 3   Down, Depressed, Hopeless 1 1 1 1 2   PHQ - 2 Score 2 2 2 2 5   Altered sleeping 1 2 1 3 1   Tired, decreased energy 1 2 2 2 1   Change in appetite 2 1 0 1 1  Feeling bad or failure about yourself  1 1 1 1 1   Trouble concentrating 1 1 1  0 0  Moving slowly or fidgety/restless 1 2 2  0 1  Suicidal thoughts 0 1 1 0 1  PHQ-9 Score 9 12 10 9 11   Difficult doing work/chores Somewhat difficult        Health Maintenance  Topic Date Due   Zoster Vaccines- Shingrix (1 of 2) Never done   DTaP/Tdap/Td (2 - Td or Tdap) 06/17/2024   INFLUENZA VACCINE  07/31/2024   Colonoscopy  01/21/2030   COVID-19 Vaccine  Completed   Hepatitis C Screening  Completed   HIV Screening  Completed   HPV VACCINES  Aged Out   Meningococcal B Vaccine  Aged Out  Colonoscopy 01/22/2020, repeat 10 years. Prostate: does not have family history of prostate cancer The natural history of prostate cancer and ongoing controversy regarding screening and potential treatment outcomes of prostate cancer has been discussed with the patient. The meaning of a false positive PSA and a false negative PSA has been discussed. He indicates understanding of the limitations of this screening test and wishes to proceed with screening PSA testing. Lab Results  Component Value Date   PSA1 3.0 03/20/2019   PSA 3.65 05/01/2023     Immunization History  Administered Date(s) Administered   Influenza, Seasonal, Injecte, Preservative Fre 11/04/2023   Influenza,inj,Quad PF,6+ Mos 02/28/2016, 10/14/2019, 10/12/2020, 11/08/2021, 10/25/2022   Influenza-Unspecified 10/01/2023   Moderna Covid-19 Fall Seasonal Vaccine 4yrs & older 11/07/2023   Moderna Covid-19 Vaccine Bivalent Booster 69yrs & up 10/12/2021   Moderna SARS-COV2 Booster Vaccination 11/04/2020   Moderna Sars-Covid-2 Vaccination 03/10/2020, 04/12/2020, 11/04/2020, 10/12/2021   Tdap 06/17/2014  [facility]  Shingrix at [place] in 2023?  No results found. Optho - due,  plans to schedule. No vision changes.   Dental:Within Last 6 months  Alcohol:  Tobacco: none  Exercise: 3 hours walking per week. Light curls, in PT.   History Patient Active Problem List   Diagnosis Date Noted   Encounter for other orthopedic aftercare 11/04/2023   Encounter for other specified surgical  aftercare 11/04/2023   Erectile dysfunction 11/04/2023   Flat foot (pes planus) (acquired), unspecified foot 11/04/2023   Gout, unspecified 11/04/2023   Intermittent explosive disorder 11/04/2023   Chronic low back pain 11/04/2023   Lumbar radiculopathy 11/04/2023   Major depressive disorder, single episode, unspecified 11/04/2023   Encounter for other preprocedural examination 11/04/2023   Other tear of lateral meniscus, current injury, right knee, initial encounter 11/04/2023   Sleep apnea, unspecified 11/04/2023   Unilateral primary osteoarthritis, right knee 11/04/2023   Mixed hyperlipidemia 10/25/2022   Depression 10/12/2020   Severe obesity (BMI 35.0-35.9 with comorbidity) (HCC) 10/12/2020   Degenerative joint disease 10/12/2020   Insomnia 10/12/2020   Knee pain, chronic 10/12/2020   HTN (hypertension) 06/17/2014   Past Medical History:  Diagnosis Date   Allergy    seasonal   COVID-19 10/23/2019   no complications, no hospitalization   Hyperlipidemia    Hypertension    Past Surgical History:  Procedure Laterality Date   KNEE ARTHROSCOPY W/ MENISCECTOMY Left 07/13/2021   MENISCUS REPAIR Right 08/13/2022   NO PAST SURGERIES     No Known Allergies Prior to Admission medications   Medication Sig Start Date End Date Taking? Authorizing Provider  allopurinol (ZYLOPRIM) 100 MG tablet Take 200 mg by mouth daily. 07/28/21  Yes [provider]  amLODipine  (NORVASC ) 5 MG tablet Take 1 tablet by mouth once daily 04/24/24  Yes Benjiman Bras, MD  atorvastatin  (LIPITOR) 20 MG tablet TAKE 1 TABLET BY MOUTH ONCE DAILY 6 IN THE EVENING 11/04/23  Yes Benjiman Bras, MD  diclofenac  Sodium (VOLTAREN ) 1 % GEL Apply topically. 04/11/23  Yes [provider]  doxycycline (MONODOX) 100 MG capsule Take 100 mg by mouth 2 (two) times daily. 09/02/23  Yes [provider]  lisinopril  (ZESTRIL ) 20 MG tablet Take 1 tablet (20 mg total) by mouth daily. 11/04/23  Yes Benjiman Bras, MD  Multiple Vitamin (MULTIVITAMIN WITH MINERALS) TABS tablet Take 1 tablet by mouth daily.   Yes [provider]  sertraline (ZOLOFT) 100 MG tablet Take by mouth. 04/11/23  Yes [provider]  sildenafil  (VIAGRA ) 100 MG tablet TAKE 1/2 TO 1 (ONE-HALF TO ONE) TABLET BY MOUTH DAILY AS NEEDED FOR ERECTILE DYSFUNCTION --  MAXIMUM  OF  100  MG  PER  24  HOUR  PERIOD 04/23/24  Yes Benjiman Bras, MD  traZODone (DESYREL) 50 MG tablet Take by mouth.   Yes [provider]   Social History   Socioeconomic History   Marital status: Single    Spouse name: Not on file   Number of children: 3   Years of education: Not on file   Highest education level: Not on file  Occupational History   Not on file  Tobacco Use   Smoking status: Never   Smokeless tobacco: Never  Vaping Use   Vaping status: Never Used  Substance and Sexual Activity   Alcohol use: Yes    Alcohol/week: 2.0 standard drinks of alcohol    Types: 1 Glasses of wine, 1 Cans of beer per week   Drug use: No   Sexual activity: Yes    Partners: Female    Comment: with multiple partners, does not use condoms always  Other Topics Concern   Not on file  Social History Narrative   Pt is from [place], [place]. Has lived in [place] since 1998. Has three children. Lives at home alone. Works at [employer] at [employer]  School.    Social Drivers of Corporate investment banker Strain: Not on file  Food Insecurity: Not on file  Transportation Needs: Not on file  Physical Activity: Insufficiently Active (02/20/2018)   Exercise Vital Sign    Days of Exercise per Week: 3 days    Minutes  of Exercise per Session: 30 min  Stress: No Stress Concern Present (02/20/2018)   [employer] of Occupational Health - Occupational Stress Questionnaire    Feeling of Stress : Not at all  Social Connections: Unknown (07/17/2022)   [facility]   Social Network    Social Network: Not on file  Intimate Partner Violence: Unknown (07/17/2022)   Received from [facility]   HITS    Physically Hurt: Not on file    Insult or Talk Down To: Not on file    Threaten Physical Harm: Not on file    Scream or Curse: Not on file    Review of Systems 13 point review of systems per patient health survey noted.  Negative other than as indicated above or in HPI.    Objective:   Vitals:   05/11/24 0807  BP: 122/76  Pulse: 60  Temp: 98.4 F (36.9 C)  TempSrc: Temporal  SpO2: 99%  Weight: 240 lb 9.6 oz (109.1 kg)  Height: 5' 8.5" (1.74 m)     Physical Exam Vitals reviewed.  Constitutional:      Appearance: He is well-developed.  HENT:     Head: Normocephalic and atraumatic.     Right Ear: External ear normal.     Left Ear: External ear normal.  Eyes:     Conjunctiva/sclera: Conjunctivae normal.     Pupils: Pupils are equal, round, and reactive to light.  Neck:     Thyroid: No thyromegaly.  Cardiovascular:     Rate and Rhythm: Normal rate and regular rhythm.     Heart sounds: Normal heart sounds.  Pulmonary:     Effort: Pulmonary effort is normal. No respiratory distress.     Breath sounds: Normal breath sounds. No wheezing.  Abdominal:     General: There is no distension.     Palpations: Abdomen is soft.     Tenderness: There is no abdominal tenderness.  Musculoskeletal:        General: No tenderness. Normal range of motion.     Cervical back: Normal range of motion and neck supple.     Comments: Rom normal except left shoulder.   Lymphadenopathy:     Cervical: No cervical adenopathy.  Skin:    General: Skin is warm and dry.   Neurological:     Mental Status: He is alert and oriented to person, place, and time.     Deep Tendon Reflexes: Reflexes are normal and symmetric.  Psychiatric:        Behavior: Behavior normal.        Assessment & Plan:  RENARD CAPERTON is a 54 y.o. male . Annual physical exam - Plan: Comprehensive metabolic panel with GFR, Lipid panel, Hemoglobin A1c, PSA  - -anticipatory guidance as below in AVS, screening labs above. Health maintenance items as above in HPI discussed/recommended as applicable.   Other depression  - Stable under the care of psychology, specialist at [facility].  Continue sertraline same dose.  Essential hypertension - Plan: Comprehensive metabolic panel with GFR, amLODipine  (NORVASC ) 5 MG tablet, lisinopril  (ZESTRIL ) 20 MG tablet  - Stable on current regimen, check labs, continue  same meds.  59-month follow-up  Hyperlipidemia, unspecified hyperlipidemia type - Plan: Lipid panel, atorvastatin  (LIPITOR) 20 MG tablet  - Tolerating Lipitor, continue same dose, check labs and adjust plan accordingly  Prediabetes - Plan: Hemoglobin A1c  - Weight has improved from prior A1c that was borderline diabetic.  Anticipate improved readings.  Continue to watch diet, activity and has follow-up planned with nutritionist, passable weight loss medications discussed.  This should also help with prediabetes/glycemic control  Erectile dysfunction, unspecified erectile dysfunction type - Plan: sildenafil  (VIAGRA ) 100 MG tablet  - Tolerating current dosage of Viagra , half pills.  Continue same as effective  Screening for prostate cancer - Plan: PSA   Meds ordered this encounter  Medications   amLODipine  (NORVASC ) 5 MG tablet    Sig: Take 1 tablet (5 mg total) by mouth daily.    Dispense:  90 tablet    Refill:  2   atorvastatin  (LIPITOR) 20 MG tablet    Sig: TAKE 1 TABLET BY MOUTH ONCE DAILY 6 IN THE EVENING    Dispense:  90 tablet    Refill:  2   lisinopril  (ZESTRIL ) 20 MG tablet     Sig: Take 1 tablet (20 mg total) by mouth daily.    Dispense:  90 tablet    Refill:  2   sildenafil  (VIAGRA ) 100 MG tablet    Sig: TAKE 1/2 TO 1 (ONE-HALF TO ONE) TABLET BY MOUTH DAILY AS NEEDED FOR ERECTILE DYSFUNCTION --  MAXIMUM  OF  100  MG  PER  24  HOUR  PERIOD    Dispense:  5 tablet    Refill:  5   Patient Instructions  Thanks for coming in today.  Keep follow-up with your specialist and providers at the [place].  I will check some labs today and let you know if there are any concerns.  No medication changes at this time.  Please let me know if there are questions and hang in there.   Preventive Care 98-41 Years Old, Male Preventive care refers to lifestyle choices and visits with your health care provider that can promote health and wellness. Preventive care visits are also called wellness exams. What can I expect for my preventive care visit? Counseling During your preventive care visit, your health care provider may ask about your: Medical history, including: Past medical problems. Family medical history. Current health, including: Emotional well-being. Home life and relationship well-being. Sexual activity. Lifestyle, including: Alcohol, nicotine or tobacco, and drug use. Access to firearms. Diet, exercise, and sleep habits. Safety issues such as seatbelt and bike helmet use. Sunscreen use. Work and work Astronomer. Physical exam Your health care provider will check your: Height and weight. These may be used to calculate your BMI (body mass index). BMI is a measurement that tells if you are at a healthy weight. Waist circumference. This measures the distance around your waistline. This measurement also tells if you are at a healthy weight and may help predict your risk of certain diseases, such as type 2 diabetes and high blood pressure. Heart rate and blood pressure. Body temperature. Skin for abnormal spots. What immunizations do I need?  Vaccines are usually given  at various ages, according to a schedule. Your health care provider will recommend vaccines for you based on your age, medical history, and lifestyle or other factors, such as travel or where you work. What tests do I need? Screening Your health care provider may recommend screening tests for certain conditions. This may include:  Lipid and cholesterol levels. Diabetes screening. This is done by checking your blood sugar (glucose) after you have not eaten for a while (fasting). Hepatitis B test. Hepatitis C test. HIV (human immunodeficiency virus) test. STI (sexually transmitted infection) testing, if you are at risk. Lung cancer screening. Prostate cancer screening. Colorectal cancer screening. Talk with your health care provider about your test results, treatment options, and if necessary, the need for more tests. Follow these instructions at home: Eating and drinking  Eat a diet that includes fresh fruits and vegetables, whole grains, lean protein, and low-fat dairy products. Take vitamin and mineral supplements as recommended by your health care provider. Do not drink alcohol if your health care provider tells you not to drink. If you drink alcohol: Limit how much you have to 0-2 drinks a day. Know how much alcohol is in your drink. In the [place] one drink equals one 12 oz bottle of beer (355 mL), one 5 oz glass of wine (148 mL), or one 1 oz glass of hard liquor (44 mL). Lifestyle Brush your teeth every morning and night with fluoride toothpaste. Floss one time each day. Exercise for at least 30 minutes 5 or more days each week. Do not use any products that contain nicotine or tobacco. These products include cigarettes, chewing tobacco, and vaping devices, such as e-cigarettes. If you need help quitting, ask your health care provider. Do not use drugs. If you are sexually active, practice safe sex. Use a condom or other form of protection to prevent STIs. Take aspirin only as told by  your health care provider. Make sure that you understand how much to take and what form to take. Work with your health care provider to find out whether it is safe and beneficial for you to take aspirin daily. Find healthy ways to manage stress, such as: Meditation, yoga, or listening to music. Journaling. Talking to a trusted person. Spending time with friends and family. Minimize exposure to UV radiation to reduce your risk of skin cancer. Safety Always wear your seat belt while driving or riding in a vehicle. Do not drive: If you have been drinking alcohol. Do not ride with someone who has been drinking. When you are tired or distracted. While texting. If you have been using any mind-altering substances or drugs. Wear a helmet and other protective equipment during sports activities. If you have firearms in your house, make sure you follow all gun safety procedures. What's next? Go to your health care provider once a year for an annual wellness visit. Ask your health care provider how often you should have your eyes and teeth checked. Stay up to date on all vaccines. This information is not intended to replace advice given to you by your health care provider. Make sure you discuss any questions you have with your health care provider. Document Revised: 06/14/2021 Document Reviewed: 06/14/2021 Elsevier Patient Education  2024 Elsevier Inc.    Signed,   Caro Christmas, MD [facility], [facility] 05/11/24 8:41 AM   05/22/24 addendum.  Elevated PSA at 4.6, discussed results with patient upper level of normal, and slight increase from last reading I will refer him to urology to discuss other testing and exam.  All questions were answered over the phone and advised him to let me know if there are further questions moving forward.

## 2024-05-11 NOTE — Patient Instructions (Signed)
 Thanks for coming in today.  Keep follow-up with your specialist and providers at the [facility].  I will check some labs today and let you know if there are any concerns.  No medication changes at this time.  Please let me know if there are questions and hang in there.   Preventive Care 76-56 Years Old, Male Preventive care refers to lifestyle choices and visits with your health care provider that can promote health and wellness. Preventive care visits are also called wellness exams. What can I expect for my preventive care visit? Counseling During your preventive care visit, your health care provider may ask about your: Medical history, including: Past medical problems. Family medical history. Current health, including: Emotional well-being. Home life and relationship well-being. Sexual activity. Lifestyle, including: Alcohol, nicotine or tobacco, and drug use. Access to firearms. Diet, exercise, and sleep habits. Safety issues such as seatbelt and bike helmet use. Sunscreen use. Work and work Astronomer. Physical exam Your health care provider will check your: Height and weight. These may be used to calculate your BMI (body mass index). BMI is a measurement that tells if you are at a healthy weight. Waist circumference. This measures the distance around your waistline. This measurement also tells if you are at a healthy weight and may help predict your risk of certain diseases, such as type 2 diabetes and high blood pressure. Heart rate and blood pressure. Body temperature. Skin for abnormal spots. What immunizations do I need?  Vaccines are usually given at various ages, according to a schedule. Your health care provider will recommend vaccines for you based on your age, medical history, and lifestyle or other factors, such as travel or where you work. What tests do I need? Screening Your health care provider may recommend screening tests for certain conditions. This may include: Lipid  and cholesterol levels. Diabetes screening. This is done by checking your blood sugar (glucose) after you have not eaten for a while (fasting). Hepatitis B test. Hepatitis C test. HIV (human immunodeficiency virus) test. STI (sexually transmitted infection) testing, if you are at risk. Lung cancer screening. Prostate cancer screening. Colorectal cancer screening. Talk with your health care provider about your test results, treatment options, and if necessary, the need for more tests. Follow these instructions at home: Eating and drinking  Eat a diet that includes fresh fruits and vegetables, whole grains, lean protein, and low-fat dairy products. Take vitamin and mineral supplements as recommended by your health care provider. Do not drink alcohol if your health care provider tells you not to drink. If you drink alcohol: Limit how much you have to 0-2 drinks a day. Know how much alcohol is in your drink. In the [place] one drink equals one 12 oz bottle of beer (355 mL), one 5 oz glass of wine (148 mL), or one 1 oz glass of hard liquor (44 mL). Lifestyle Brush your teeth every morning and night with fluoride toothpaste. Floss one time each day. Exercise for at least 30 minutes 5 or more days each week. Do not use any products that contain nicotine or tobacco. These products include cigarettes, chewing tobacco, and vaping devices, such as e-cigarettes. If you need help quitting, ask your health care provider. Do not use drugs. If you are sexually active, practice safe sex. Use a condom or other form of protection to prevent STIs. Take aspirin only as told by your health care provider. Make sure that you understand how much to take and what form to take.  Work with your health care provider to find out whether it is safe and beneficial for you to take aspirin daily. Find healthy ways to manage stress, such as: Meditation, yoga, or listening to music. Journaling. Talking to a trusted  person. Spending time with friends and family. Minimize exposure to UV radiation to reduce your risk of skin cancer. Safety Always wear your seat belt while driving or riding in a vehicle. Do not drive: If you have been drinking alcohol. Do not ride with someone who has been drinking. When you are tired or distracted. While texting. If you have been using any mind-altering substances or drugs. Wear a helmet and other protective equipment during sports activities. If you have firearms in your house, make sure you follow all gun safety procedures. What's next? Go to your health care provider once a year for an annual wellness visit. Ask your health care provider how often you should have your eyes and teeth checked. Stay up to date on all vaccines. This information is not intended to replace advice given to you by your health care provider. Make sure you discuss any questions you have with your health care provider. Document Revised: 06/14/2021 Document Reviewed: 06/14/2021 Elsevier Patient Education  2024 [employer].

## 2024-05-17 ENCOUNTER — Ambulatory Visit: Payer: Self-pay | Admitting: Family Medicine

## 2024-05-22 NOTE — Addendum Note (Signed)
 Addended by: Sally Menard R on: 05/22/2024 01:46 PM   Modules accepted: Orders

## 2024-07-01 ENCOUNTER — Encounter: Payer: Self-pay | Admitting: Family Medicine

## 2024-11-11 ENCOUNTER — Ambulatory Visit: Admitting: Family Medicine

## 2024-11-11 VITALS — BP 126/74 | HR 58 | Temp 98.4°F | Resp 16 | Ht 68.5 in | Wt 236.2 lb

## 2024-11-11 DIAGNOSIS — C61 Malignant neoplasm of prostate: Secondary | ICD-10-CM | POA: Diagnosis not present

## 2024-11-11 DIAGNOSIS — M1A9XX0 Chronic gout, unspecified, without tophus (tophi): Secondary | ICD-10-CM

## 2024-11-11 DIAGNOSIS — R7303 Prediabetes: Secondary | ICD-10-CM | POA: Diagnosis not present

## 2024-11-11 DIAGNOSIS — E785 Hyperlipidemia, unspecified: Secondary | ICD-10-CM | POA: Diagnosis not present

## 2024-11-11 DIAGNOSIS — N529 Male erectile dysfunction, unspecified: Secondary | ICD-10-CM

## 2024-11-11 DIAGNOSIS — I10 Essential (primary) hypertension: Secondary | ICD-10-CM

## 2024-11-11 LAB — LIPID PANEL
Cholesterol: 131 mg/dL (ref 0–200)
HDL: 36.7 mg/dL — ABNORMAL LOW (ref >39.00)
LDL Cholesterol: 79 mg/dL (ref 0–99)
NonHDL: 93.96
Total CHOL/HDL Ratio: 4
Triglycerides: 73 mg/dL (ref 0.0–149.0)
VLDL: 14.6 mg/dL (ref 0.0–40.0)

## 2024-11-11 LAB — COMPREHENSIVE METABOLIC PANEL WITH GFR
ALT: 20 U/L (ref 0–53)
AST: 20 U/L (ref 0–37)
Albumin: 4.6 g/dL (ref 3.5–5.2)
Alkaline Phosphatase: 102 U/L (ref 39–117)
BUN: 15 mg/dL (ref 6–23)
CO2: 32 meq/L (ref 19–32)
Calcium: 9.7 mg/dL (ref 8.4–10.5)
Chloride: 101 meq/L (ref 96–112)
Creatinine, Ser: 1.09 mg/dL (ref 0.40–1.50)
GFR: 76.21 mL/min (ref >60.00)
Glucose, Bld: 76 mg/dL (ref 70–99)
Potassium: 4.6 meq/L (ref 3.5–5.1)
Sodium: 142 meq/L (ref 135–145)
Total Bilirubin: 0.8 mg/dL (ref 0.2–1.2)
Total Protein: 7.5 g/dL (ref 6.0–8.3)

## 2024-11-11 LAB — HEMOGLOBIN A1C: Hgb A1c MFr Bld: 6.4 % (ref 4.6–6.5)

## 2024-11-11 LAB — URIC ACID: Uric Acid, Serum: 6.2 mg/dL (ref 4.0–7.8)

## 2024-11-11 MED ORDER — SILDENAFIL CITRATE 100 MG PO TABS
ORAL_TABLET | ORAL | 5 refills | Status: AC
Start: 2024-11-11 — End: ?

## 2024-11-11 MED ORDER — AMLODIPINE BESYLATE 5 MG PO TABS: 5.0000 mg | ORAL_TABLET | Freq: Every day | ORAL | 2 refills | Status: AC

## 2024-11-11 MED ORDER — ATORVASTATIN CALCIUM 20 MG PO TABS: ORAL_TABLET | ORAL | 2 refills | Status: AC

## 2024-11-11 NOTE — Progress Notes (Signed)
 Subjective:  Patient ID: Hunter Adams, male    DOB: 1970/11/06  Age: 54 y.o. MRN: [id number]  CC:  Chief Complaint  Patient presents with   Follow-up    6 month, Not Fasting Flu shot- already had Covid vaccine- states he is up to date Td- due Hep B titers- needed  Discuss prostate concerns    HPI [name] presents for  Chronic condition follow-up.  Mom passed away in Sep 30, 2024.  Has been meeting with therapist locally - denies any needs at this time.   Hypertension: Amlodipine  5 mg, lisinopril  20 mg daily.  Denies any side effects. Home readings: 120/70 range. Using CPAP since February.   BP Readings from Last 3 Encounters:  11/11/24 126/74  05/11/24 122/76  11/04/23 118/76   Lab Results  Component Value Date   CREATININE 1.10 05/11/2024   Hyperlipidemia: Lipitor 20 mg daily without new myalgias/side effects. Lab Results  Component Value Date   CHOL 126 05/11/2024   HDL 33.60 (L) 05/11/2024   LDLCALC 71 05/11/2024   TRIG 103.0 05/11/2024   CHOLHDL 4 05/11/2024   Lab Results  Component Value Date   ALT 16 05/11/2024   AST 17 05/11/2024   ALKPHOS 96 05/11/2024   BILITOT 0.7 05/11/2024   Gout: Last flare: None recently. Daily meds: Still taking allopurinol 100 mg daily, no need for prn med Lab Results  Component Value Date   LABURIC 4.9 05/09/2022   Prediabetes: Weight has improved since last visit.  Previously had been improving with dietary changes.  Had planned on meeting with nutritionist at the [facility] - appt last Friday - has new list of foods. Had been to 228, 227. Walking for exercise. Avoiding fast food, sweet tea, soda and meal prep recently.  Lab Results  Component Value Date   HGBA1C 6.4 05/11/2024   Wt Readings from Last 3 Encounters:  11/11/24 236 lb 4 oz (107.2 kg)  05/11/24 240 lb 9.6 oz (109.1 kg)  11/04/23 246 lb 6.4 oz (111.8 kg)    Prostate cancer: PSA was elevated at his physical in May, 4.64 up from 3.65  previously.  Referred to urology. Note reviewed from September 18 at urology.  Dr. Selma.  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  Localized low risk prostate cancer on active surveillance.  Plan for PSA every 6 months with prostate MRI in 1 year confirmatory biopsy in 1 year.   Tdap had at [facility] in past few years.  Hepatitis B vaccine - had at [facility]  History Patient Active Problem List   Diagnosis Date Noted   Encounter for other orthopedic aftercare 11/04/2023   Encounter for other specified surgical aftercare 11/04/2023   Erectile dysfunction 11/04/2023   Flat foot (pes planus) (acquired), unspecified foot 11/04/2023   Gout, unspecified 11/04/2023   Intermittent explosive disorder 11/04/2023   Chronic low back pain 11/04/2023   Lumbar radiculopathy 11/04/2023   Major depressive disorder, single episode, unspecified 11/04/2023   Encounter for other preprocedural examination 11/04/2023   Other tear of lateral meniscus, current injury, right knee, initial encounter 11/04/2023   Sleep apnea, unspecified 11/04/2023   Unilateral primary osteoarthritis, right knee 11/04/2023   Mixed hyperlipidemia 10/25/2022   Depression 10/12/2020   Severe obesity (BMI 35.0-35.9 with comorbidity) (HCC) 10/12/2020   Degenerative joint disease 10/12/2020   Insomnia 10/12/2020   Knee pain, chronic 10/12/2020   HTN (hypertension) 06/17/2014   Past Medical History:  Diagnosis Date  Allergy    seasonal   COVID-19 10/23/2019   no complications, no hospitalization   Hyperlipidemia    Hypertension    Past Surgical History:  Procedure Laterality Date   KNEE ARTHROSCOPY W/ MENISCECTOMY Left 07/13/2021   MENISCUS REPAIR Right 08/13/2022   NO PAST SURGERIES     No Known Allergies Prior to Admission medications   Medication Sig Start Date End Date Taking? Authorizing Provider  allopurinol (ZYLOPRIM) 100 MG tablet Take 200 mg by mouth daily. 07/28/21  Yes [provider]   amLODipine  (NORVASC ) 5 MG tablet Take 1 tablet (5 mg total) by mouth daily. 05/11/24  Yes Levora Reyes SAUNDERS, MD  atorvastatin  (LIPITOR) 20 MG tablet TAKE 1 TABLET BY MOUTH ONCE DAILY 6 IN THE EVENING 05/11/24  Yes Levora Reyes SAUNDERS, MD  diclofenac  Sodium (VOLTAREN ) 1 % GEL Apply topically. 04/11/23  Yes [provider]  lisinopril  (ZESTRIL ) 20 MG tablet Take 1 tablet (20 mg total) by mouth daily. 05/11/24  Yes Levora Reyes SAUNDERS, MD  Multiple Vitamin (MULTIVITAMIN WITH MINERALS) TABS tablet Take 1 tablet by mouth daily.   Yes [provider]  sertraline (ZOLOFT) 100 MG tablet Take by mouth. Patient taking differently: Take 50 mg by mouth daily. 04/11/23  Yes [provider]  sildenafil  (VIAGRA ) 100 MG tablet TAKE 1/2 TO 1 (ONE-HALF TO ONE) TABLET BY MOUTH DAILY AS NEEDED FOR ERECTILE DYSFUNCTION --  MAXIMUM  OF  100  MG  PER  24  HOUR  PERIOD 05/11/24  Yes Levora Reyes SAUNDERS, MD  doxycycline (MONODOX) 100 MG capsule Take 100 mg by mouth 2 (two) times daily. Patient not taking: Reported on 11/11/2024 09/02/23   [provider]  traZODone (DESYREL) 50 MG tablet Take by mouth. Patient not taking: Reported on 11/11/2024    [provider]   Social History   Socioeconomic History   Marital status: Single    Spouse name: Not on file   Number of children: 3   Years of education: Not on file   Highest education level: Master's degree (e.g., MA, MS, MEng, MEd, MSW, MBA)  Occupational History   Not on file  Tobacco Use   Smoking status: Never   Smokeless tobacco: Never  Vaping Use   Vaping status: Never Used  Substance and Sexual Activity   Alcohol use: Yes    Alcohol/week: 2.0 standard drinks of alcohol    Types: 1 Glasses of wine, 1 Cans of beer per week   Drug use: No   Sexual activity: Yes    Partners: Female    Comment: with multiple partners, does not use condoms always  Other Topics Concern   Not on file  Social History Narrative   Pt is from  [place], [place]. Has lived in [place] since 1998. Has three children. Lives at home alone. Works at [employer] at [employer] and [employer].    Social Drivers of Corporate Investment Banker Strain: Low Risk  (11/11/2024)   Overall Financial Resource Strain (CARDIA)    Difficulty of Paying Living Expenses: Not hard at all  Food Insecurity: No Food Insecurity (11/11/2024)   Hunger Vital Sign    Worried About Running Out of Food in the Last Year: Never true    Ran Out of Food in the Last Year: Never true  Transportation Needs: No Transportation Needs (11/11/2024)   PRAPARE - Administrator, Civil Service (Medical): No    Lack of Transportation (Non-Medical): No  Physical Activity: Sufficiently Active (11/11/2024)   Exercise Vital Sign    Days of Exercise per Week: 3 days    Minutes of Exercise per Session: 60 min  Stress: Stress Concern Present (11/11/2024)   [employer] of Occupational Health - Occupational Stress Questionnaire    Feeling of Stress: Rather much  Social Connections: Moderately Integrated (11/11/2024)   Social Connection and Isolation Panel    Frequency of Communication with Friends and Family: Three times a week    Frequency of Social Gatherings with Friends and Family: Once a week    Attends Religious Services: More than 4 times per year    Active Member of [employer] or Organizations: Yes    Attends Banker Meetings: More than 4 times per year    Marital Status: Never married  Intimate Partner Violence: Unknown (07/17/2022)   Received from [facility]   HITS    Physically Hurt: Not on file    Insult or Talk Down To: Not on file    Threaten Physical Harm: Not on file    Scream or Curse: Not on file    Review of Systems  Constitutional:  Negative for fatigue and unexpected weight change.  Eyes:  Negative for visual disturbance.  Respiratory:  Negative for cough, chest tightness and shortness of breath.   Cardiovascular:  Negative for  chest pain, palpitations and leg swelling.  Gastrointestinal:  Negative for abdominal pain and blood in stool.  Neurological:  Negative for dizziness, light-headedness and headaches.    Objective:   Vitals:   11/11/24 0927  BP: 126/74  Pulse: (!) 58  Resp: 16  Temp: 98.4 F (36.9 C)  TempSrc: Oral  SpO2: 99%  Weight: 236 lb 4 oz (107.2 kg)  Height: 5' 8.5 (1.74 m)     Physical Exam Vitals reviewed.  Constitutional:      Appearance: He is well-developed.  HENT:     Head: Normocephalic and atraumatic.  Neck:     Vascular: No carotid bruit or JVD.  Cardiovascular:     Rate and Rhythm: Normal rate and regular rhythm.     Heart sounds: Normal heart sounds. No murmur heard. Pulmonary:     Effort: Pulmonary effort is normal.     Breath sounds: Normal breath sounds. No rales.  Musculoskeletal:     Right lower leg: No edema.     Left lower leg: No edema.  Skin:    General: Skin is warm and dry.  Neurological:     Mental Status: He is alert and oriented to person, place, and time.  Psychiatric:        Mood and Affect: Mood normal.      Assessment & Plan:  MAJOR SANTERRE is a 54 y.o. male . Hyperlipidemia, unspecified hyperlipidemia type - Plan: Comprehensive metabolic panel with GFR, Lipid panel, atorvastatin  (LIPITOR) 20 MG tablet  -  Stable, tolerating current regimen. Medications refilled. Labs pending as above.   Prediabetes - Plan: Hemoglobin A1c  - Check A1c and adjust plan accordingly.  Continue watch diet, exercise.  Essential hypertension - Plan: amLODipine  (NORVASC ) 5 MG tablet  - Stable with current regimen.  Prostate cancer managed with active surveillance [facility])  - Followed by urology as above with planned repeat PSA at 6 months.  Chronic gout without tophus, unspecified cause, unspecified site - Plan: Uric acid  - Asymptomatic, continue same plan with allopurinol, check uric acid with RTC precautions if flares.  Erectile dysfunction,  unspecified erectile dysfunction  type - Plan: sildenafil  (VIAGRA ) 100 MG tablet  - Now followed by urology, refilled Viagra .  Meds ordered this encounter  Medications   amLODipine  (NORVASC ) 5 MG tablet    Sig: Take 1 tablet (5 mg total) by mouth daily.    Dispense:  90 tablet    Refill:  2   atorvastatin  (LIPITOR) 20 MG tablet    Sig: TAKE 1 TABLET BY MOUTH ONCE DAILY 6 IN THE EVENING    Dispense:  90 tablet    Refill:  2   sildenafil  (VIAGRA ) 100 MG tablet    Sig: TAKE 1/2 TO 1 (ONE-HALF TO ONE) TABLET BY MOUTH DAILY AS NEEDED FOR ERECTILE DYSFUNCTION --  MAXIMUM  OF  100  MG  PER  24  HOUR  PERIOD    Dispense:  5 tablet    Refill:  5   Patient Instructions  Thanks for coming in today.  I am sorry to hear about your loss.  Continue follow-up with therapist as planned, I think that is a great idea.  I am happy to help further if needed.  Please reach out at any time.  I will check your labs today but I expect levels to be stable.  Keep up the good work with weight loss.  Continue follow-up with urology.  I do think that active surveillance is a reasonable approach.  Let me know if there are questions and hang in there!    Signed,   Reyes Pines, MD [facility] 11/11/24 10:12 AM

## 2024-11-11 NOTE — Patient Instructions (Signed)
 Thanks for coming in today.  I am sorry to hear about your loss.  Continue follow-up with therapist as planned, I think that is a great idea.  I am happy to help further if needed.  Please reach out at any time.  I will check your labs today but I expect levels to be stable.  Keep up the good work with weight loss.  Continue follow-up with urology.  I do think that active surveillance is a reasonable approach.  Let me know if there are questions and hang in there!

## 2024-11-14 ENCOUNTER — Ambulatory Visit: Payer: Self-pay | Admitting: Family Medicine

## 2024-11-19 NOTE — Progress Notes (Signed)
 Lab results have been discussed.   Verbalized understanding? Yes  Are there any questions? No

## 2024-12-11 ENCOUNTER — Encounter: Payer: Self-pay | Admitting: Family Medicine

## 2024-12-11 NOTE — Telephone Encounter (Signed)
 Patient would like feed back if taking Vitamin D3 5000 international units will help improve prostate health?

## 2025-05-12 ENCOUNTER — Encounter: Admitting: Family Medicine
